# Patient Record
Sex: Male | Born: 1990 | Race: White | Hispanic: No | Smoking: Never smoker
Health system: Southern US, Community
[De-identification: ages and names within clinical notes are randomized; demographics above are authoritative.]

## PROBLEM LIST (undated history)

## (undated) DIAGNOSIS — Z789 Other specified health status: Secondary | ICD-10-CM

---
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
## 2021-01-04 ENCOUNTER — Emergency Department (HOSPITAL_COMMUNITY): Payer: Commercial Managed Care - PPO

## 2021-01-04 ENCOUNTER — Other Ambulatory Visit: Payer: Self-pay

## 2021-01-04 ENCOUNTER — Inpatient Hospital Stay (HOSPITAL_COMMUNITY): Payer: Commercial Managed Care - PPO

## 2021-01-04 ENCOUNTER — Inpatient Hospital Stay (HOSPITAL_COMMUNITY)
Admission: EM | Admit: 2021-01-04 | Discharge: 2021-01-10 | DRG: 460 | Disposition: A | Discharge status: Home / Self Care | Payer: Commercial Managed Care - PPO | Attending: Neurosurgery | Admitting: Neurosurgery

## 2021-01-04 ENCOUNTER — Encounter (HOSPITAL_COMMUNITY): Payer: Self-pay | Admitting: Emergency Medicine

## 2021-01-04 DIAGNOSIS — K567 Ileus, unspecified: Secondary | ICD-10-CM | POA: Diagnosis not present

## 2021-01-04 DIAGNOSIS — G8929 Other chronic pain: Secondary | ICD-10-CM | POA: Diagnosis present

## 2021-01-04 DIAGNOSIS — S32011A Stable burst fracture of first lumbar vertebra, initial encounter for closed fracture: Principal | ICD-10-CM | POA: Diagnosis present

## 2021-01-04 DIAGNOSIS — S32009A Unspecified fracture of unspecified lumbar vertebra, initial encounter for closed fracture: Secondary | ICD-10-CM | POA: Diagnosis present

## 2021-01-04 DIAGNOSIS — E871 Hypo-osmolality and hyponatremia: Secondary | ICD-10-CM | POA: Diagnosis not present

## 2021-01-04 DIAGNOSIS — R112 Nausea with vomiting, unspecified: Secondary | ICD-10-CM

## 2021-01-04 DIAGNOSIS — Z20822 Contact with and (suspected) exposure to covid-19: Secondary | ICD-10-CM | POA: Diagnosis present

## 2021-01-04 DIAGNOSIS — Z419 Encounter for procedure for purposes other than remedying health state, unspecified: Secondary | ICD-10-CM

## 2021-01-04 DIAGNOSIS — M40209 Unspecified kyphosis, site unspecified: Secondary | ICD-10-CM | POA: Diagnosis present

## 2021-01-04 DIAGNOSIS — S32010A Wedge compression fracture of first lumbar vertebra, initial encounter for closed fracture: Secondary | ICD-10-CM

## 2021-01-04 HISTORY — DX: Other specified health status: Z78.9

## 2021-01-04 LAB — RESP PANEL BY RT-PCR (FLU A&B, COVID) ARPGX2
Influenza A by PCR: NEGATIVE
Influenza B by PCR: NEGATIVE
SARS Coronavirus 2 by RT PCR: NEGATIVE

## 2021-01-04 LAB — CBC WITH DIFFERENTIAL/PLATELET
Abs Immature Granulocytes: 0.94 10*3/uL — ABNORMAL HIGH (ref 0.00–0.07)
Basophils Absolute: 0.1 10*3/uL (ref 0.0–0.1)
Basophils Relative: 1 %
Eosinophils Absolute: 0 10*3/uL (ref 0.0–0.5)
Eosinophils Relative: 0 %
HCT: 46.1 % (ref 39.0–52.0)
Hemoglobin: 15.6 g/dL (ref 13.0–17.0)
Immature Granulocytes: 3 %
Lymphocytes Relative: 6 %
Lymphs Abs: 1.8 10*3/uL (ref 0.7–4.0)
MCH: 30.8 pg (ref 26.0–34.0)
MCHC: 33.8 g/dL (ref 30.0–36.0)
MCV: 91.1 fL (ref 80.0–100.0)
Monocytes Absolute: 2.1 10*3/uL — ABNORMAL HIGH (ref 0.1–1.0)
Monocytes Relative: 7 %
Neutro Abs: 24.8 10*3/uL — ABNORMAL HIGH (ref 1.7–7.7)
Neutrophils Relative %: 83 %
Platelets: 357 10*3/uL (ref 150–400)
RBC: 5.06 MIL/uL (ref 4.22–5.81)
RDW: 12.1 % (ref 11.5–15.5)
WBC: 29.8 10*3/uL — ABNORMAL HIGH (ref 4.0–10.5)
nRBC: 0 % (ref 0.0–0.2)

## 2021-01-04 LAB — BASIC METABOLIC PANEL
Anion gap: 10 (ref 5–15)
BUN: 15 mg/dL (ref 6–20)
CO2: 23 mmol/L (ref 22–32)
Calcium: 9.7 mg/dL (ref 8.9–10.3)
Chloride: 102 mmol/L (ref 98–111)
Creatinine, Ser: 0.74 mg/dL (ref 0.61–1.24)
GFR, Estimated: >60 mL/min (ref >60)
Glucose, Bld: 110 mg/dL — ABNORMAL HIGH (ref 70–99)
Potassium: 4.3 mmol/L (ref 3.5–5.1)
Sodium: 135 mmol/L (ref 135–145)
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
## 2021-01-04 IMAGING — MR MR THORACIC SPINE W/O CM
5 of 7 series · 30 of 48 positions shown · non-contrast
Comparison: Prior CTs from earlier the same day.

CLINICAL DATA: Initial evaluation for acute trauma.

EXAM:
MRI THORACIC AND LUMBAR SPINE WITHOUT CONTRAST
TECHNIQUE: Multiplanar and multiecho pulse sequences of the thoracic and lumbar
spine were obtained without intravenous contrast.

[Series 16: T1 · sagittal · 4.0mm · 1.72mm/px · 3 of 12 slices shown (1 of 3)]
[im 1/12]
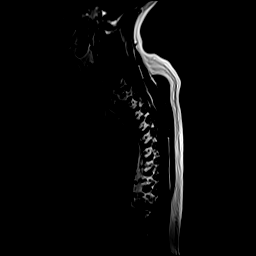
[im 6/12]
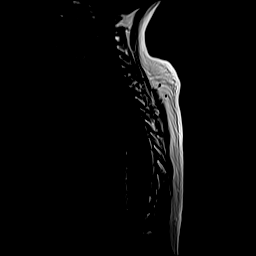
[im 12/12]
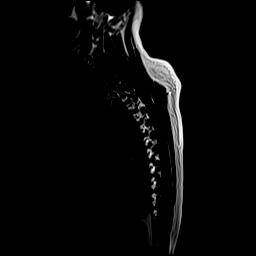

[Series 18: T1 · sagittal · 3.0mm · 1.00mm/px · 4 of 15 slices shown (2 of 3)]
[im 1/15]
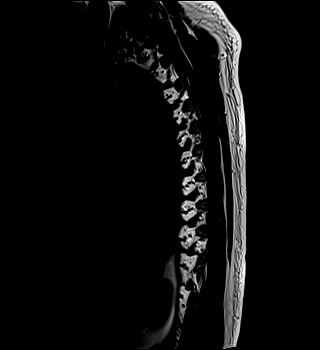
[im 5/15]
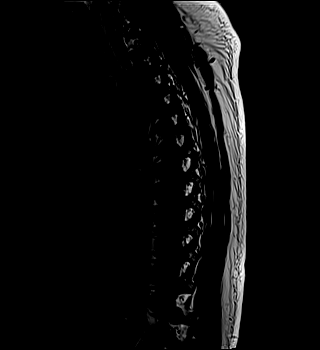
[im 10/15]
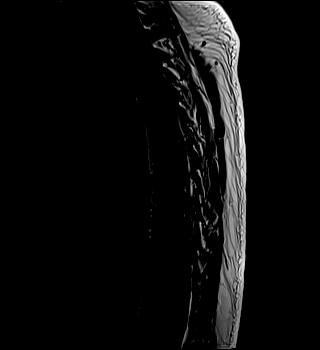
[im 15/15]
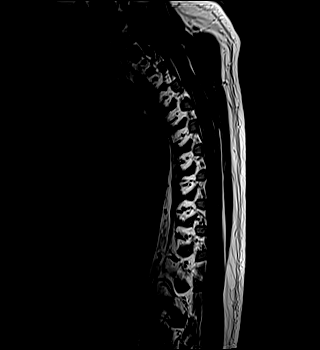

[Series 19: T2 · sagittal · 3.0mm · 1.00mm/px · 4 of 17 slices shown (1 of 2)]
[im 1/17]
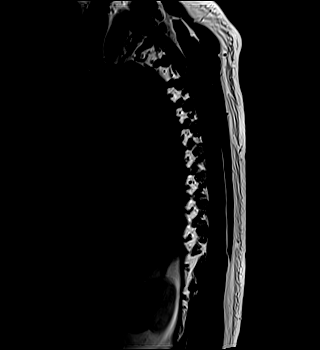
[im 6/17]
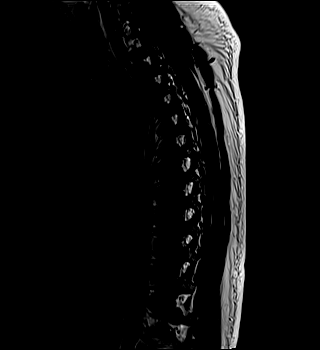
[im 11/17]
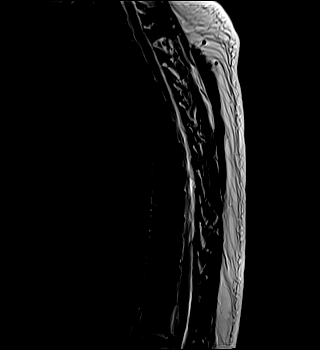
[im 17/17]
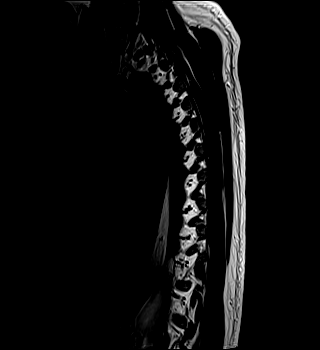

[Series 20: T2 · axial · 4.0mm · 0.78mm/px · z∈[-267,-15]mm · 11 of 42 slices shown (2 of 2)]
[im 1/42]
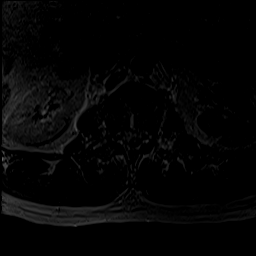
[im 5/42]
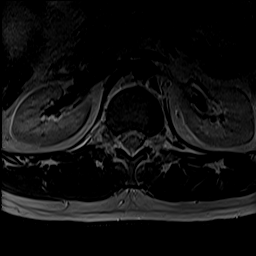
[im 9/42]
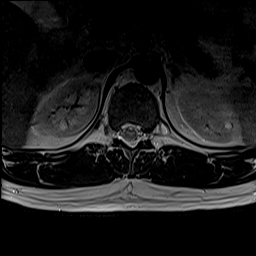
[im 13/42]
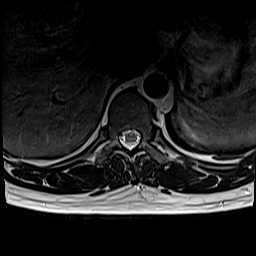
[im 17/42]
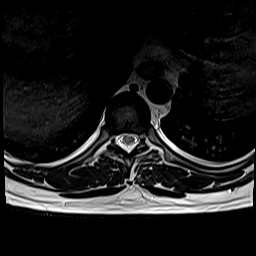
[im 21/42]
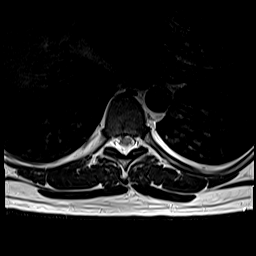
[im 25/42]
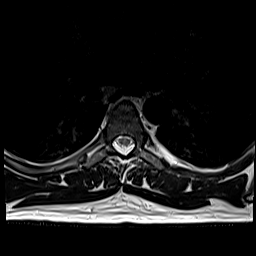
[im 29/42]
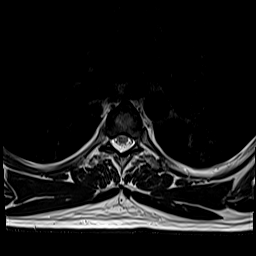
[im 33/42]
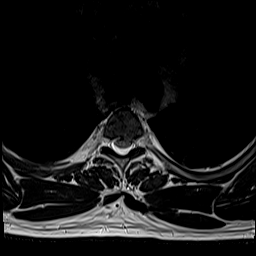
[im 37/42]
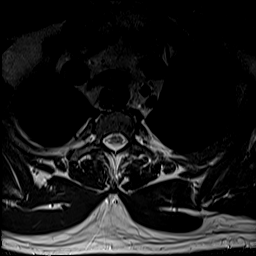
[im 42/42]
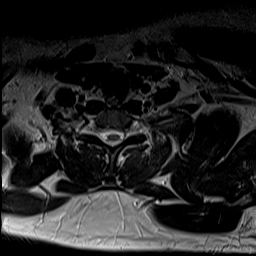

[Series 21: T1 · axial · 4.0mm · 0.39mm/px · z∈[-267,-15]mm · 8 of 42 slices shown (3 of 3)]
[im 1/42]
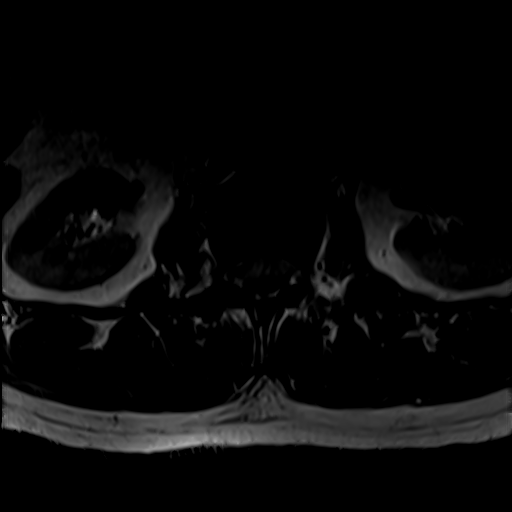
[im 9/42]
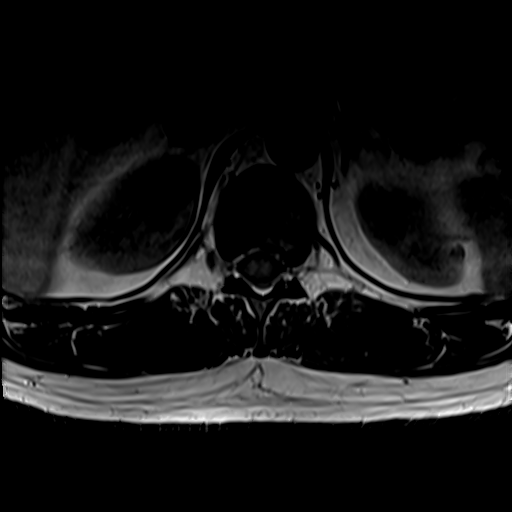
[im 13/42]
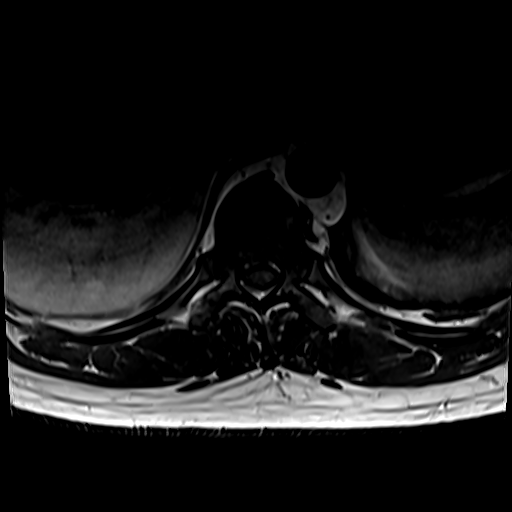
[im 17/42]
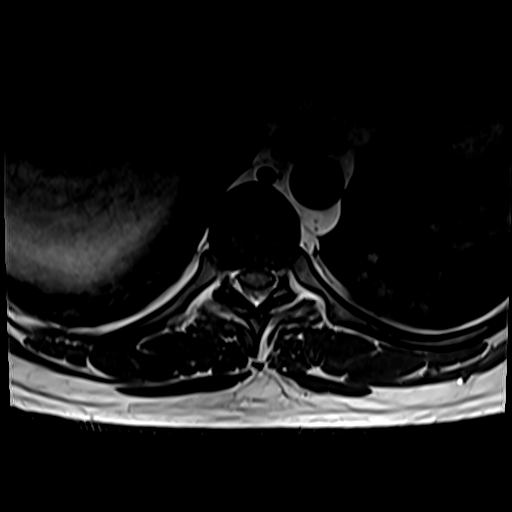
[im 25/42]
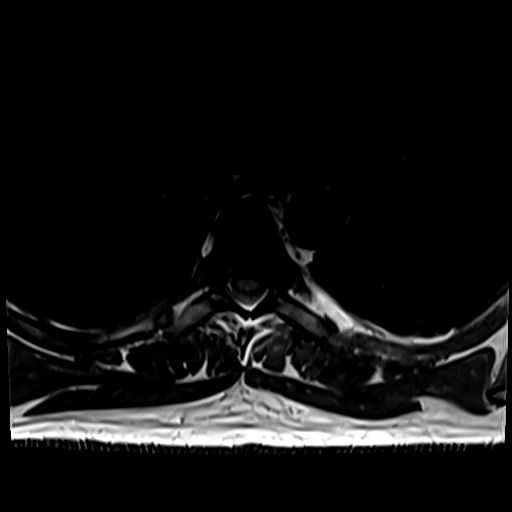
[im 29/42]
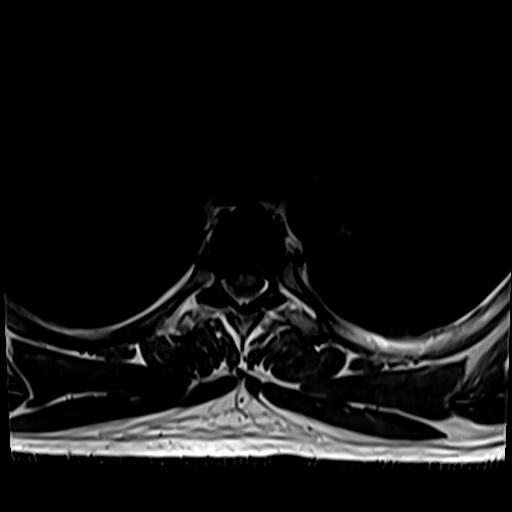
[im 33/42]
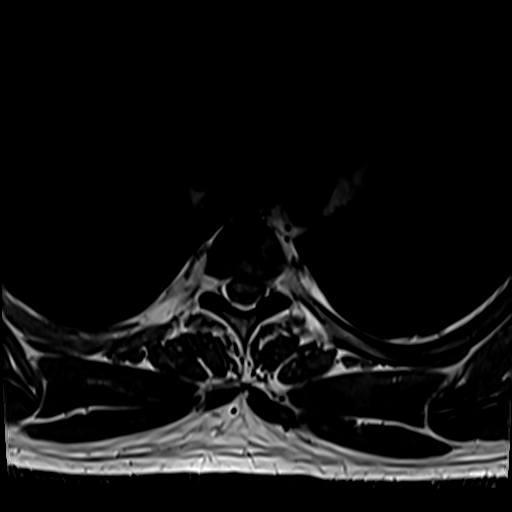
[im 42/42]
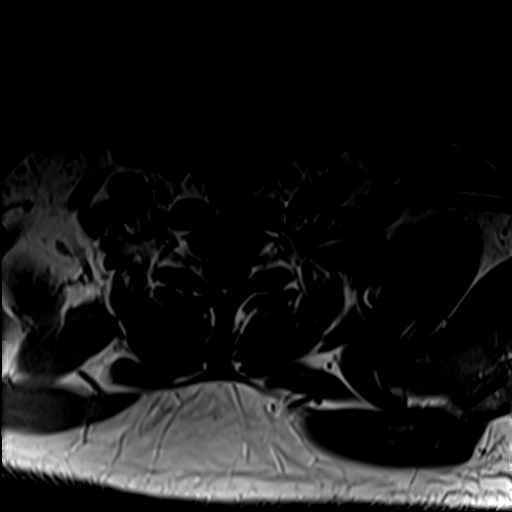

[30 of 48 positions shown; findings below may reference images not displayed]

FINDINGS: MRI THORACIC SPINE FINDINGS

Alignment: Physiologic with preservation of the normal thoracic
kyphosis. No listhesis.

Vertebrae: Vertebral body height maintained without acute or chronic
fracture. Bone marrow signal intensity diffusely decreased on T1
weighted imaging, nonspecific, but most commonly related to anemia,
smoking, or obesity. No worrisome osseous lesions. No abnormal
marrow edema.

Cord: Normal signal and morphology. Probable small amount of
epidural hemorrhage seen within the ventral epidural space extending
to approximately the level of T9 related to the acute lumbar spine
fractures (series 22, image 27). No significant stenosis.

Paraspinal and other soft tissues: Paraspinous soft tissues
demonstrate no acute finding. Subcentimeter simple cyst noted at the
upper pole the left kidney. Visualized visceral structures otherwise
unremarkable.

Disc levels:

T6-7: Small right paracentral disc protrusion mildly indents the
right ventral thecal sac (series 22, image 19). No significant
stenosis.

T7-8: Small right paracentral disc protrusion mildly indents the
right ventral thecal sac (series 22, image 22). No significant
stenosis.

Additional mild noncompressive disc bulging noted elsewhere within
the thoracic spine. No significant spinal stenosis. Foramina remain
patent.

MRI LUMBAR SPINE FINDINGS

Segmentation: Standard. Lowest well-formed disc space labeled the
L5-S1 level.

Alignment: Physiologic with preservation of the normal lumbar
lordosis. No listhesis.

Vertebrae: Acute burst type compression fracture involving the L1
vertebral body again seen. Associated height loss measures up to 35%
with 8 mm bony retropulsion. Additional acute compression fracture
involving the superior endplate of L2 with 15% height loss without
bony retropulsion. Acute compression fracture involving the superior
endplate of L3 with 15% height loss without bony retropulsion. Acute
compression fracture involving L4 with mild 10% height loss without
bony retropulsion.

Additional edema noted about the partially visualized right SI joint
(series 7, image 1). No definite visible fracture. Finding most
likely reflects changes of sacroiliitis. Possible more mild changes
about the contralateral left SI joint noted as well. Remainder of
the partially visualized sacrum and pelvis otherwise grossly intact.

Decreased T1 signal intensity seen throughout the visualized bone
marrow. No worrisome osseous lesions.

Conus medullaris and cauda equina: Conus extends to the T12-L1
level. Conus and cauda equina appear normal.

Paraspinal and other soft tissues: Paraspinous edema adjacent to the
acute upper lumbar spinal fractures. Subcentimeter simple cyst at
the upper pole the left kidney. Visualized visceral structures
otherwise unremarkable.

Disc levels:

T12-L1: 8 mm bony retropulsion related to the acute L1 compression
fracture. Small amount of epidural blood products seen coursing
cephalad and caudad within the adjacent ventral epidural space.
Resultant severe spinal stenosis with the thecal sac measuring 4-5
mm at its most narrow point. Foramina remain patent.

L1-2: Negative interspace. Small amount of epidural and/or
intradural hemorrhage at the ventral thecal sac. Mild to moderate
spinal stenosis. Foramina remain patent.

L2-3: Negative interspace. No significant spinal stenosis. Foramina
remain patent.

L3-4: Circumferential disc bulge with disc desiccation. Endplate
spurring. Superimposed central disc protrusion with annular fissure.
Prominence of the dorsal epidural fat. Resultant fairly severe
spinal stenosis. Foramina remain patent.

L4-5: Degenerative intervertebral disc space narrowing with disc
desiccation and diffuse disc bulge. Endplate spurring. Central
annular fissure. Mild facet hypertrophy. Mild epidural lipomatosis.
Mild spinal stenosis. Foramina remain patent.

L5-S1: Degenerative intervertebral disc desiccation and reactive
endplate spurring. Broad-based central disc protrusion closely
approximates the descending S1 nerve roots bilaterally as they
course through the lateral recesses. No significant spinal stenosis.
Foramina remain patent.
IMPRESSION: 1. Acute burst type compression fracture involving the L1 vertebral
body with up to 35% height loss and 8 mm bony retropulsion.
Superimposed epidural and/or intradural hemorrhage with resultant
severe spinal stenosis.
2. Additional acute compression fractures involving the L2 through
L4 vertebral bodies with mild 10-15% height loss without bony
retropulsion.
3. No other acute traumatic injury within the thoracic spine.
4. Edema about the partially visualized right SI joint, most likely
related to changes of sacroiliitis. Possible more mild changes about
the contralateral left SI joint also noted. Follow-up examination
with dedicated nonemergent pelvic MRI could be performed for further
evaluation as warranted.
5. Multifactorial degenerative changes at L3-4 with resultant severe
spinal stenosis.
6. Broad-based central disc protrusion at L5-S1, closely
approximating the descending S1 nerve roots without frank
impingement.
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
## 2021-01-04 MED ORDER — HYDROCODONE-ACETAMINOPHEN 5-325 MG PO TABS
2.0000 | ORAL_TABLET | ORAL | Status: DC | PRN
  Administered 2021-01-05 – 2021-01-06 (×3): 2 via ORAL
  Filled 2021-01-04 (×3): qty 2

## 2021-01-04 MED ORDER — ACETAMINOPHEN 325 MG PO TABS: 650.0000 mg | ORAL_TABLET | ORAL | Status: DC | PRN

## 2021-01-04 MED ORDER — FENTANYL CITRATE PF 50 MCG/ML IJ SOSY
100.0000 ug | PREFILLED_SYRINGE | Freq: Once | INTRAMUSCULAR | Status: AC
  Administered 2021-01-04: 100 ug via INTRAVENOUS
  Filled 2021-01-04: qty 2

## 2021-01-04 MED ORDER — METHOCARBAMOL 1000 MG/10ML IJ SOLN
500.0000 mg | Freq: Four times a day (QID) | INTRAVENOUS | Status: DC | PRN
  Filled 2021-01-04: qty 5

## 2021-01-04 MED ORDER — ACETAMINOPHEN 650 MG RE SUPP: 650.0000 mg | RECTAL | Status: DC | PRN

## 2021-01-04 MED ORDER — MENTHOL 3 MG MT LOZG
1.0000 | LOZENGE | OROMUCOSAL | Status: DC | PRN
  Filled 2021-01-04: qty 9

## 2021-01-04 MED ORDER — HYDROMORPHONE HCL 1 MG/ML IJ SOLN
0.5000 mg | INTRAMUSCULAR | Status: DC | PRN
  Administered 2021-01-04 – 2021-01-06 (×9): 0.5 mg via INTRAVENOUS
  Filled 2021-01-04: qty 0.5
  Filled 2021-01-04 (×2): qty 1
  Filled 2021-01-04: qty 0.5
  Filled 2021-01-04: qty 1
  Filled 2021-01-04 (×2): qty 0.5
  Filled 2021-01-04 (×3): qty 1

## 2021-01-04 MED ORDER — SODIUM CHLORIDE 0.9 % IV SOLN: 250.0000 mL | INTRAVENOUS | Status: DC

## 2021-01-04 MED ORDER — HYDROCODONE-ACETAMINOPHEN 5-325 MG PO TABS
1.0000 | ORAL_TABLET | ORAL | Status: DC | PRN
  Filled 2021-01-04 (×2): qty 1

## 2021-01-04 MED ORDER — FLEET ENEMA 7-19 GM/118ML RE ENEM: 1.0000 | ENEMA | Freq: Once | RECTAL | Status: DC | PRN

## 2021-01-04 MED ORDER — POLYETHYLENE GLYCOL 3350 17 G PO PACK: 17.0000 g | PACK | Freq: Every day | ORAL | Status: DC | PRN

## 2021-01-04 MED ORDER — ONDANSETRON HCL 4 MG/2ML IJ SOLN: 4.0000 mg | Freq: Four times a day (QID) | INTRAMUSCULAR | Status: DC | PRN

## 2021-01-04 MED ORDER — SODIUM CHLORIDE 0.9% FLUSH: 3.0000 mL | INTRAVENOUS | Status: DC | PRN

## 2021-01-04 MED ORDER — SODIUM CHLORIDE 0.9% FLUSH
3.0000 mL | Freq: Two times a day (BID) | INTRAVENOUS | Status: DC
  Administered 2021-01-04 – 2021-01-05 (×3): 3 mL via INTRAVENOUS

## 2021-01-04 MED ORDER — DOCUSATE SODIUM 100 MG PO CAPS
100.0000 mg | ORAL_CAPSULE | Freq: Two times a day (BID) | ORAL | Status: DC
  Administered 2021-01-05: 100 mg via ORAL
  Filled 2021-01-04: qty 1

## 2021-01-04 MED ORDER — POTASSIUM CHLORIDE IN NACL 20-0.9 MEQ/L-% IV SOLN
INTRAVENOUS | Status: DC
  Filled 2021-01-04: qty 1000

## 2021-01-04 MED ORDER — METHOCARBAMOL 500 MG PO TABS
500.0000 mg | ORAL_TABLET | Freq: Four times a day (QID) | ORAL | Status: DC | PRN
  Administered 2021-01-05: 500 mg via ORAL
  Filled 2021-01-04: qty 1

## 2021-01-04 MED ORDER — FENTANYL CITRATE PF 50 MCG/ML IJ SOSY
50.0000 ug | PREFILLED_SYRINGE | Freq: Once | INTRAMUSCULAR | Status: AC
  Administered 2021-01-04: 50 ug via INTRAVENOUS
  Filled 2021-01-04: qty 1

## 2021-01-04 MED ORDER — ENOXAPARIN SODIUM 40 MG/0.4ML IJ SOSY: 40.0000 mg | PREFILLED_SYRINGE | INTRAMUSCULAR | Status: AC

## 2021-01-04 MED ORDER — ONDANSETRON HCL 4 MG PO TABS: 4.0000 mg | ORAL_TABLET | Freq: Four times a day (QID) | ORAL | Status: DC | PRN

## 2021-01-04 MED ORDER — PHENOL 1.4 % MT LIQD
1.0000 | OROMUCOSAL | Status: DC | PRN
  Filled 2021-01-04: qty 177
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
## 2021-01-04 NOTE — ED Notes (Signed)
MRI called 

## 2021-01-05 ENCOUNTER — Other Ambulatory Visit: Payer: Self-pay | Admitting: Neurosurgery

## 2021-01-05 NOTE — ED Notes (Signed)
Pt's oxygen dropped to 87. I placed him on 2L of oxygen by nasal cannula and his oxygen went back to 93

## 2021-01-05 NOTE — ED Notes (Signed)
RN gave patients friend, Katrine Coho [phone], update.

## 2021-01-05 NOTE — Progress Notes (Addendum)
Patient arrived to [facility] 3W08 from [facility]. VSS. Safety precautions and orders reviewed with patient. SCDs applied. Attending MD  paged/left message with after hr call service. Patient also request to have his mother called.  Patient currently denied any pain or appears in any distress. Will continue to monitor.   RN did see patient on the schedule for OR tomorrow and made him aware to be NPO after MN.
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
## 2021-01-06 ENCOUNTER — Inpatient Hospital Stay (HOSPITAL_COMMUNITY): Payer: Commercial Managed Care - PPO | Admitting: Anesthesiology

## 2021-01-06 ENCOUNTER — Encounter (HOSPITAL_COMMUNITY)
Admission: EM | Disposition: A | Discharge status: Home / Self Care | Payer: Self-pay | Source: Home / Self Care | Attending: Neurosurgery

## 2021-01-06 ENCOUNTER — Inpatient Hospital Stay (HOSPITAL_COMMUNITY): Payer: Commercial Managed Care - PPO

## 2021-01-06 ENCOUNTER — Encounter (HOSPITAL_COMMUNITY): Payer: Self-pay | Admitting: Neurosurgery

## 2021-01-06 HISTORY — PX: POSTERIOR LUMBAR FUSION 4 LEVEL: SHX6037

## 2021-01-06 LAB — SURGICAL PCR SCREEN
MRSA, PCR: NEGATIVE
Staphylococcus aureus: POSITIVE — AB

## 2021-01-06 LAB — TYPE AND SCREEN
ABO/RH(D): A POS
Antibody Screen: NEGATIVE

## 2021-01-06 LAB — ABO/RH: ABO/RH(D): A POS

## 2021-01-06 IMAGING — CR DG CHEST 1V
1 series · 1 of 1 positions shown · non-contrast
Comparison: None.

CLINICAL DATA: Shortness of breath

EXAM:
CHEST  1 VIEW

[chest ap]
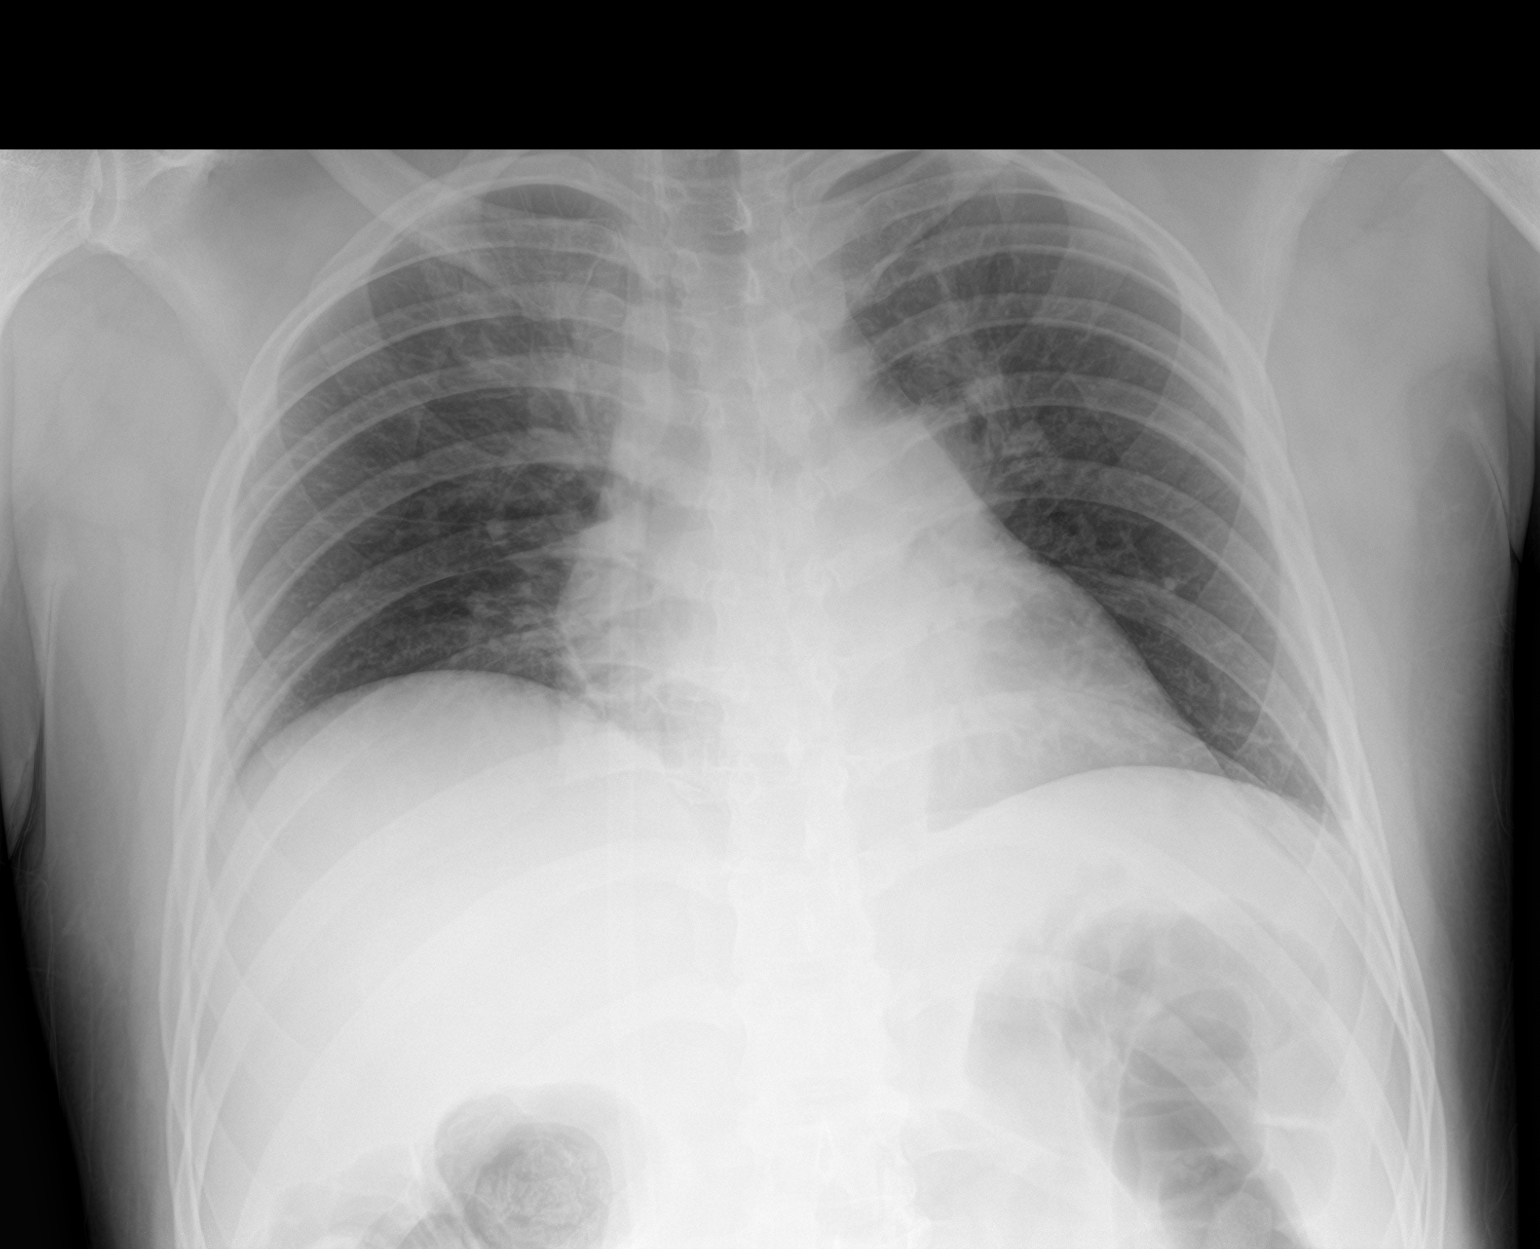

[1 of 1 positions shown; findings below may reference images not displayed]

FINDINGS: There is poor inspiration. Transverse diameter of heart is slightly
increased which may be partly due to poor inspiration. There are no
signs of pulmonary edema or focal pulmonary consolidation. There are
linear densities in the right upper lung field and medial right
lower lung fields. There is no significant pleural effusion or
pneumothorax.
IMPRESSION: There are no signs of pulmonary edema or focal pulmonary
consolidation. There are small linear densities in the right upper
and medial right lower lung fields suggesting scarring or
subsegmental atelectasis.

## 2021-01-06 IMAGING — RF DG THORACOLUMBAR SPINE 2V
1 series · 5 of 5 positions shown · non-contrast
Comparison: Thoracic and lumbar spine CT and MRI [DATE].

CLINICAL DATA: T11-L3 posterior fusion.

EXAM:
THORACOLUMBAR SPINE 1V

[Series 1: run · 5 of 5 slices shown]
[im 1/5]
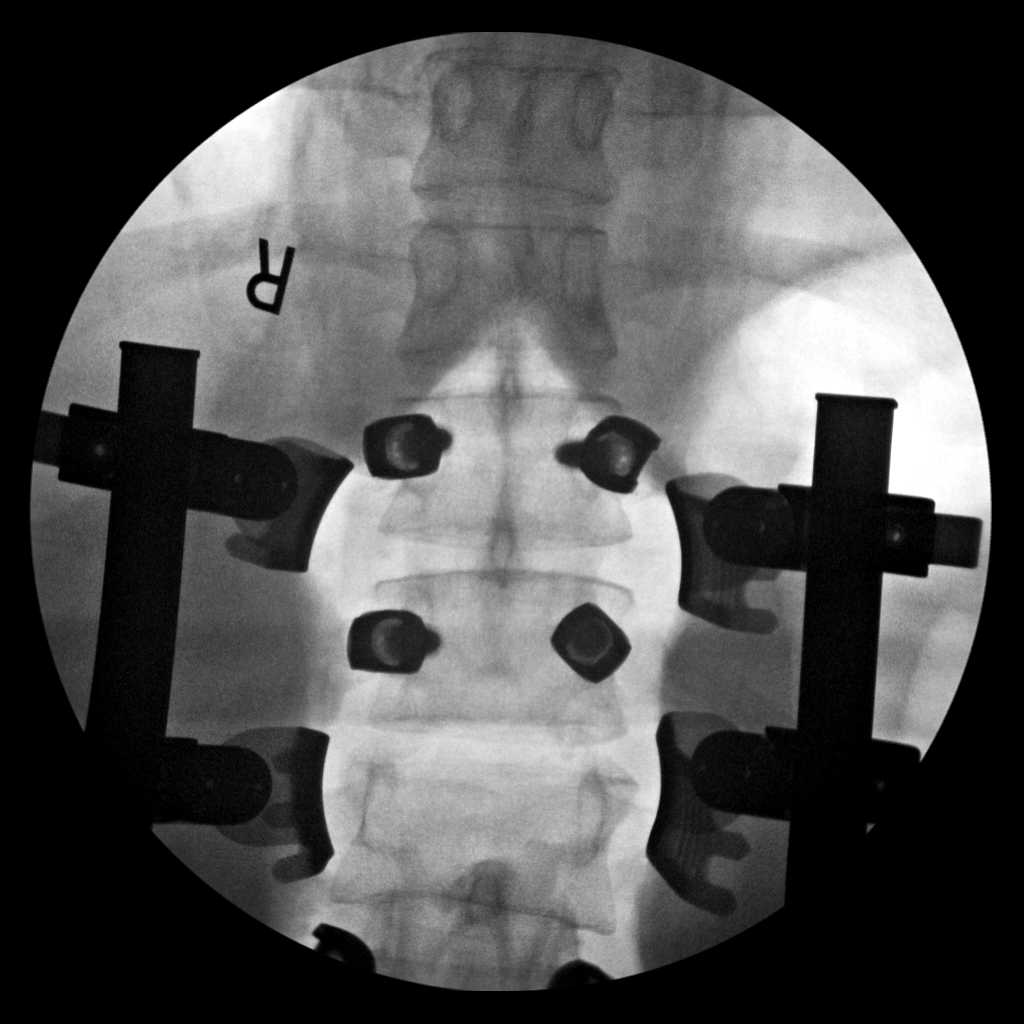
[im 2/5]
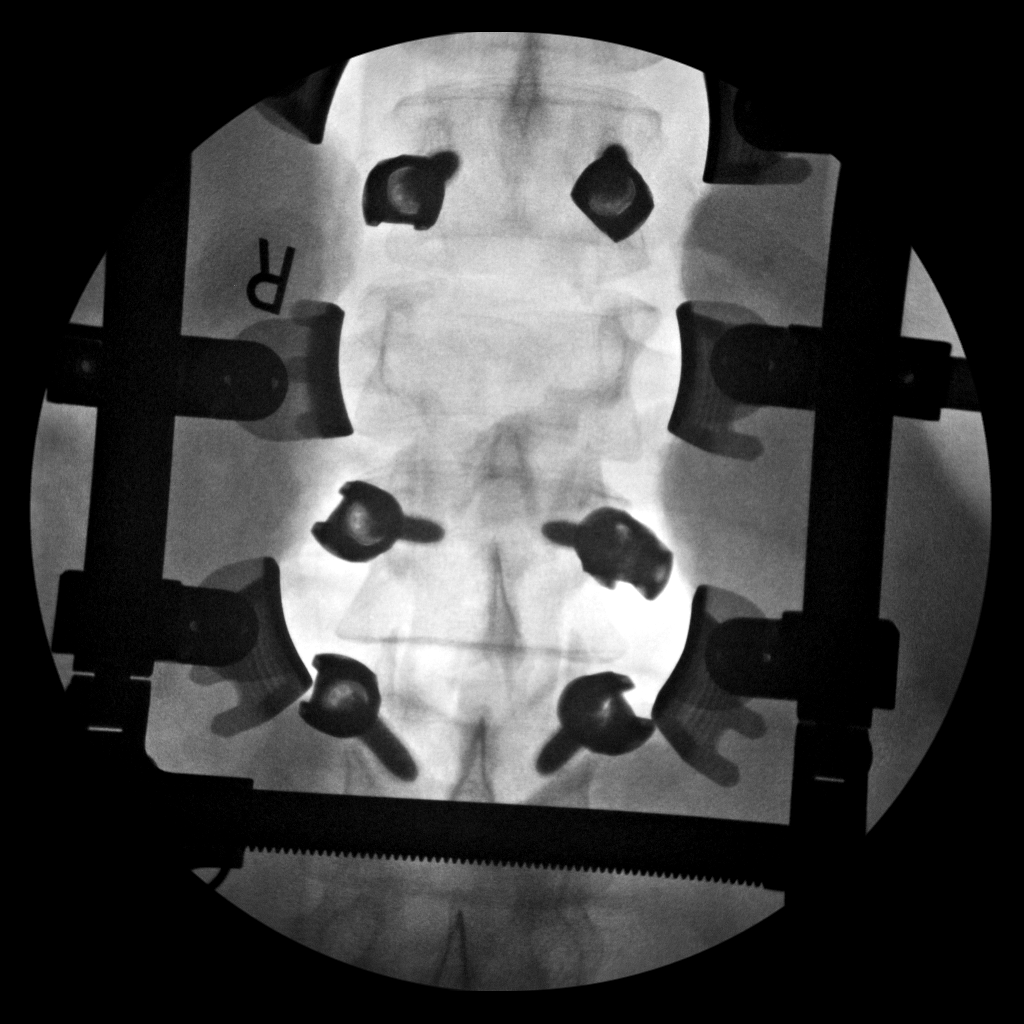
[im 3/5]
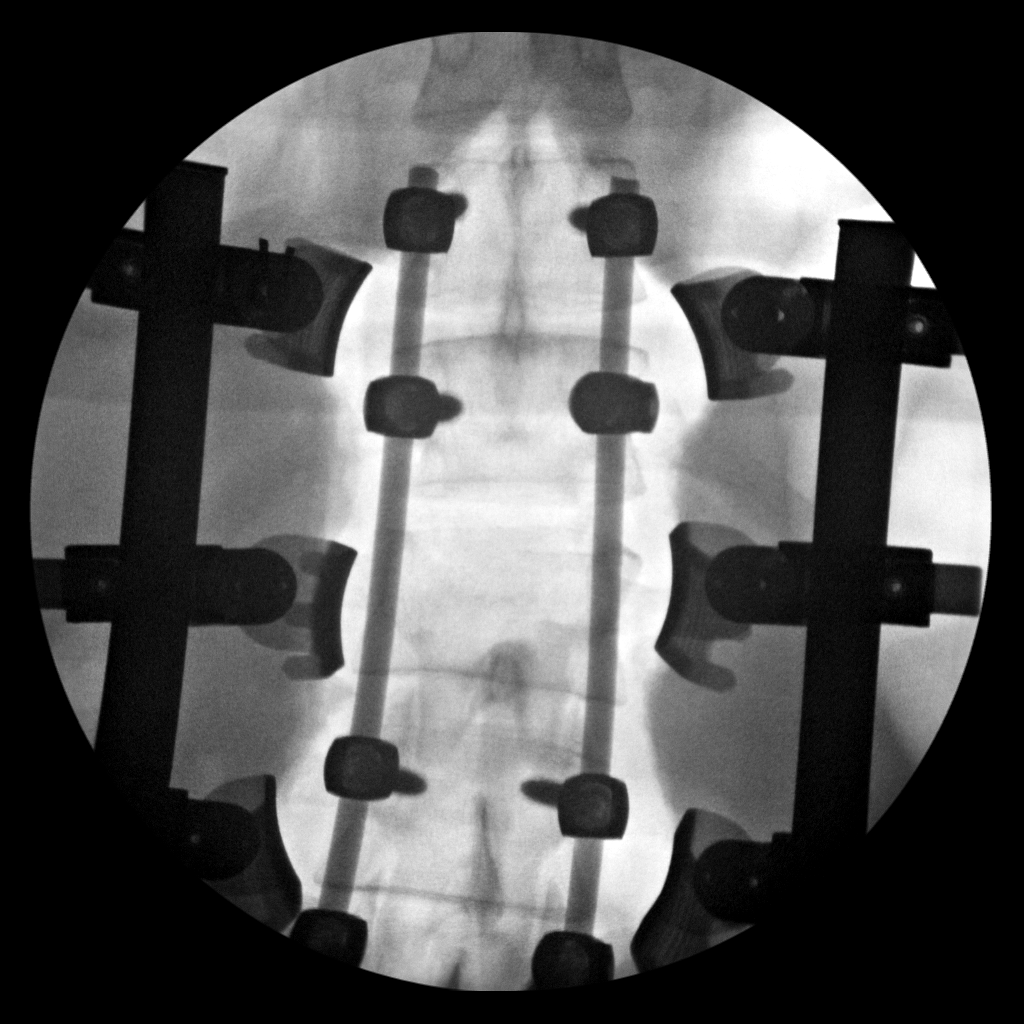
[im 4/5]
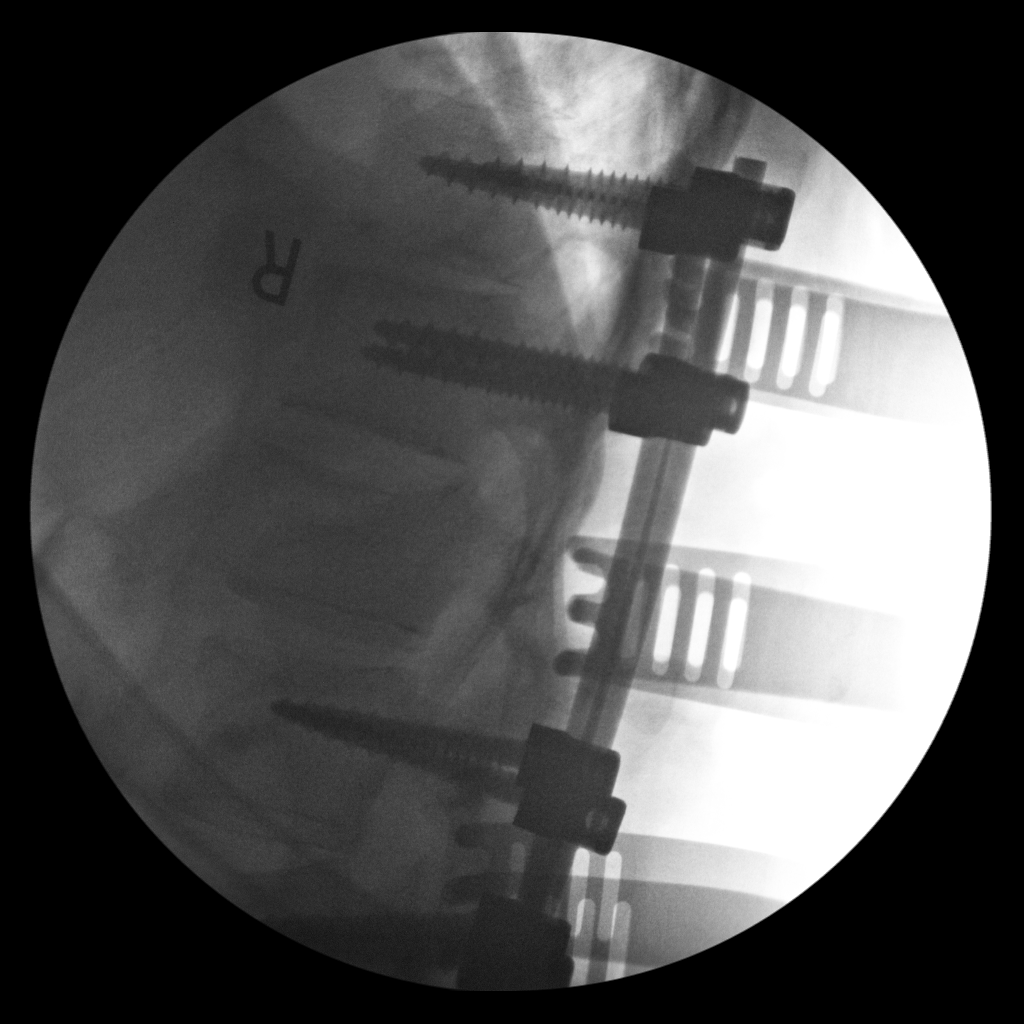
[im 5/5]
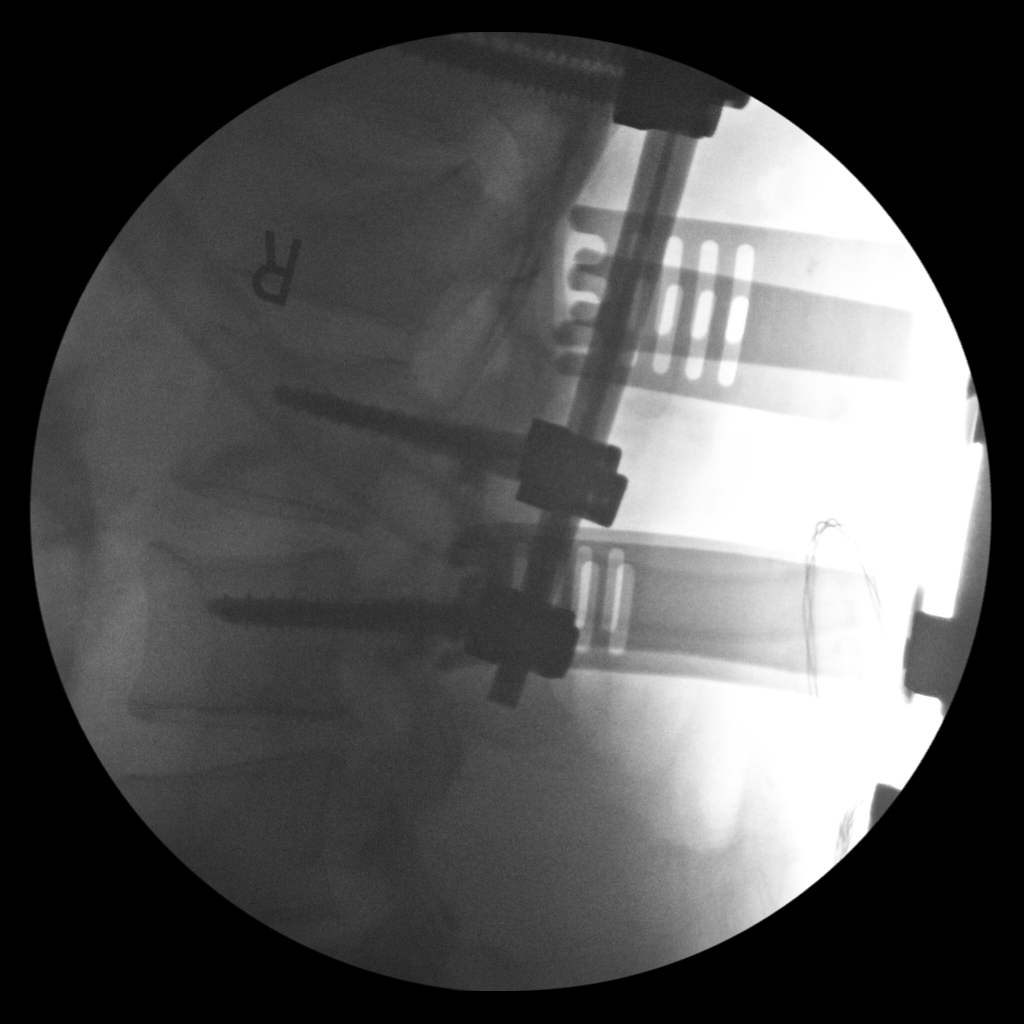

[5 of 5 positions shown; findings below may reference images not displayed]

FINDINGS: Intraoperative thoracolumbar spine.

5 low resolution intraoperative spot views of the thoracolumbar
spine were obtained. T11-L3 posterior fusion hardware is present.
Alignment is grossly anatomic.

Total fluoroscopy time: 1 minute 48 seconds

Total radiation dose: 53.62 micro Gy
IMPRESSION: Intraoperative T11-L3 posterior fusion.

## 2021-01-06 SURGERY — POSTERIOR LUMBAR FUSION 4 LEVEL
Anesthesia: General

## 2021-01-06 MED ORDER — SODIUM CHLORIDE 0.9% FLUSH
3.0000 mL | Freq: Two times a day (BID) | INTRAVENOUS | Status: DC
  Administered 2021-01-07 – 2021-01-10 (×7): 3 mL via INTRAVENOUS

## 2021-01-06 MED ORDER — FENTANYL CITRATE (PF) 250 MCG/5ML IJ SOLN
INTRAMUSCULAR | Status: AC
  Filled 2021-01-06: qty 5

## 2021-01-06 MED ORDER — ROCURONIUM BROMIDE 10 MG/ML (PF) SYRINGE
PREFILLED_SYRINGE | INTRAVENOUS | Status: AC
  Filled 2021-01-06: qty 10

## 2021-01-06 MED ORDER — DEXAMETHASONE SODIUM PHOSPHATE 10 MG/ML IJ SOLN
INTRAMUSCULAR | Status: DC | PRN
  Administered 2021-01-06: 10 mg via INTRAVENOUS

## 2021-01-06 MED ORDER — LIDOCAINE-EPINEPHRINE 1 %-1:100000 IJ SOLN
INTRAMUSCULAR | Status: AC
  Filled 2021-01-06: qty 1

## 2021-01-06 MED ORDER — MIDAZOLAM HCL 5 MG/5ML IJ SOLN
INTRAMUSCULAR | Status: DC | PRN
  Administered 2021-01-06: 2 mg via INTRAVENOUS

## 2021-01-06 MED ORDER — LIDOCAINE 2% (20 MG/ML) 5 ML SYRINGE
INTRAMUSCULAR | Status: AC
  Filled 2021-01-06: qty 5

## 2021-01-06 MED ORDER — PROPOFOL 500 MG/50ML IV EMUL
INTRAVENOUS | Status: DC | PRN
  Administered 2021-01-06: 125 ug/kg/min via INTRAVENOUS

## 2021-01-06 MED ORDER — BUPIVACAINE LIPOSOME 1.3 % IJ SUSP
INTRAMUSCULAR | Status: DC | PRN
  Administered 2021-01-06: 20 mL

## 2021-01-06 MED ORDER — DOCUSATE SODIUM 100 MG PO CAPS
100.0000 mg | ORAL_CAPSULE | Freq: Two times a day (BID) | ORAL | Status: DC
  Administered 2021-01-06 – 2021-01-10 (×8): 100 mg via ORAL
  Filled 2021-01-06 (×8): qty 1

## 2021-01-06 MED ORDER — MENTHOL 3 MG MT LOZG
1.0000 | LOZENGE | OROMUCOSAL | Status: DC | PRN
  Filled 2021-01-06: qty 9

## 2021-01-06 MED ORDER — POTASSIUM CHLORIDE IN NACL 20-0.9 MEQ/L-% IV SOLN
INTRAVENOUS | Status: DC
  Filled 2021-01-06: qty 1000

## 2021-01-06 MED ORDER — ACETAMINOPHEN 10 MG/ML IV SOLN: 1000.0000 mg | Freq: Once | INTRAVENOUS | Status: DC | PRN

## 2021-01-06 MED ORDER — ACETAMINOPHEN 500 MG PO TABS: 1000.0000 mg | ORAL_TABLET | Freq: Once | ORAL | Status: DC | PRN

## 2021-01-06 MED ORDER — PROPOFOL 10 MG/ML IV BOLUS
INTRAVENOUS | Status: DC | PRN
  Administered 2021-01-06: 110 mg via INTRAVENOUS
  Administered 2021-01-06: 20 mg via INTRAVENOUS
  Administered 2021-01-06: 70 mg via INTRAVENOUS

## 2021-01-06 MED ORDER — ONDANSETRON HCL 4 MG PO TABS
4.0000 mg | ORAL_TABLET | Freq: Four times a day (QID) | ORAL | Status: DC | PRN
  Administered 2021-01-08: 03:00:00 4 mg via ORAL

## 2021-01-06 MED ORDER — OXYCODONE HCL 5 MG PO TABS
10.0000 mg | ORAL_TABLET | ORAL | Status: DC | PRN
  Administered 2021-01-06 – 2021-01-09 (×13): 10 mg via ORAL
  Filled 2021-01-06 (×14): qty 2

## 2021-01-06 MED ORDER — ALBUMIN HUMAN 5 % IV SOLN: INTRAVENOUS | Status: DC | PRN

## 2021-01-06 MED ORDER — TRANEXAMIC ACID-NACL 1000-0.7 MG/100ML-% IV SOLN
INTRAVENOUS | Status: AC
  Filled 2021-01-06: qty 100

## 2021-01-06 MED ORDER — ONDANSETRON HCL 4 MG/2ML IJ SOLN
INTRAMUSCULAR | Status: AC
  Filled 2021-01-06: qty 2

## 2021-01-06 MED ORDER — ENOXAPARIN SODIUM 40 MG/0.4ML IJ SOSY
40.0000 mg | PREFILLED_SYRINGE | INTRAMUSCULAR | Status: DC
  Administered 2021-01-07 – 2021-01-10 (×4): 40 mg via SUBCUTANEOUS
  Filled 2021-01-06 (×4): qty 0.4

## 2021-01-06 MED ORDER — LIDOCAINE 2% (20 MG/ML) 5 ML SYRINGE
INTRAMUSCULAR | Status: DC | PRN
  Administered 2021-01-06: 60 mg via INTRAVENOUS

## 2021-01-06 MED ORDER — CEFAZOLIN SODIUM-DEXTROSE 1-4 GM/50ML-% IV SOLN
1.0000 g | Freq: Three times a day (TID) | INTRAVENOUS | Status: AC
  Administered 2021-01-06 – 2021-01-08 (×6): 1 g via INTRAVENOUS
  Filled 2021-01-06 (×6): qty 50

## 2021-01-06 MED ORDER — ONDANSETRON HCL 4 MG/2ML IJ SOLN
INTRAMUSCULAR | Status: DC | PRN
  Administered 2021-01-06: 4 mg via INTRAVENOUS

## 2021-01-06 MED ORDER — PROPOFOL 1000 MG/100ML IV EMUL
INTRAVENOUS | Status: AC
  Filled 2021-01-06: qty 100

## 2021-01-06 MED ORDER — METHOCARBAMOL 500 MG PO TABS
500.0000 mg | ORAL_TABLET | Freq: Four times a day (QID) | ORAL | Status: DC | PRN
  Administered 2021-01-08 – 2021-01-10 (×2): 500 mg via ORAL
  Filled 2021-01-06 (×2): qty 1

## 2021-01-06 MED ORDER — 0.9 % SODIUM CHLORIDE (POUR BTL) OPTIME
TOPICAL | Status: DC | PRN
  Administered 2021-01-06: 1000 mL

## 2021-01-06 MED ORDER — ACETAMINOPHEN 325 MG PO TABS
650.0000 mg | ORAL_TABLET | ORAL | Status: DC | PRN
  Administered 2021-01-08: 03:00:00 650 mg via ORAL
  Filled 2021-01-06: qty 2

## 2021-01-06 MED ORDER — PHENOL 1.4 % MT LIQD: 1.0000 | OROMUCOSAL | Status: DC | PRN

## 2021-01-06 MED ORDER — CHLORHEXIDINE GLUCONATE CLOTH 2 % EX PADS: 6.0000 | MEDICATED_PAD | Freq: Once | CUTANEOUS | Status: DC

## 2021-01-06 MED ORDER — BUPIVACAINE LIPOSOME 1.3 % IJ SUSP
INTRAMUSCULAR | Status: AC
  Filled 2021-01-06: qty 20

## 2021-01-06 MED ORDER — BUPIVACAINE HCL (PF) 0.5 % IJ SOLN
INTRAMUSCULAR | Status: DC | PRN
  Administered 2021-01-06: 30 mL

## 2021-01-06 MED ORDER — LIDOCAINE-EPINEPHRINE 1 %-1:100000 IJ SOLN
INTRAMUSCULAR | Status: DC | PRN
  Administered 2021-01-06: 17 mL

## 2021-01-06 MED ORDER — POLYETHYLENE GLYCOL 3350 17 G PO PACK: 17.0000 g | PACK | Freq: Every day | ORAL | Status: DC | PRN

## 2021-01-06 MED ORDER — FLEET ENEMA 7-19 GM/118ML RE ENEM: 1.0000 | ENEMA | Freq: Once | RECTAL | Status: DC | PRN

## 2021-01-06 MED ORDER — BACITRACIN ZINC 500 UNIT/GM EX OINT
TOPICAL_OINTMENT | CUTANEOUS | Status: AC
  Filled 2021-01-06: qty 28.35

## 2021-01-06 MED ORDER — SENNA 8.6 MG PO TABS
1.0000 | ORAL_TABLET | Freq: Two times a day (BID) | ORAL | Status: DC
  Administered 2021-01-06 – 2021-01-10 (×8): 8.6 mg via ORAL
  Filled 2021-01-06 (×9): qty 1

## 2021-01-06 MED ORDER — OXYCODONE HCL 5 MG PO TABS
5.0000 mg | ORAL_TABLET | ORAL | Status: DC | PRN
  Administered 2021-01-08 – 2021-01-10 (×6): 5 mg via ORAL
  Filled 2021-01-06 (×7): qty 1

## 2021-01-06 MED ORDER — CEFAZOLIN SODIUM-DEXTROSE 2-4 GM/100ML-% IV SOLN
2.0000 g | INTRAVENOUS | Status: AC
  Administered 2021-01-06: 2 g via INTRAVENOUS

## 2021-01-06 MED ORDER — FENTANYL CITRATE (PF) 100 MCG/2ML IJ SOLN: 25.0000 ug | INTRAMUSCULAR | Status: DC | PRN

## 2021-01-06 MED ORDER — MUPIROCIN 2 % EX OINT: 1.0000 "application " | TOPICAL_OINTMENT | Freq: Two times a day (BID) | CUTANEOUS | Status: DC

## 2021-01-06 MED ORDER — TRANEXAMIC ACID-NACL 1000-0.7 MG/100ML-% IV SOLN
INTRAVENOUS | Status: DC | PRN
  Administered 2021-01-06: 1000 mg via INTRAVENOUS

## 2021-01-06 MED ORDER — CEFAZOLIN SODIUM-DEXTROSE 2-4 GM/100ML-% IV SOLN
INTRAVENOUS | Status: AC
  Filled 2021-01-06: qty 100

## 2021-01-06 MED ORDER — CHLORHEXIDINE GLUCONATE 0.12 % MT SOLN
OROMUCOSAL | Status: AC
  Administered 2021-01-06: 15 mL via OROMUCOSAL
  Filled 2021-01-06: qty 15

## 2021-01-06 MED ORDER — SODIUM CHLORIDE 0.9% FLUSH: 3.0000 mL | INTRAVENOUS | Status: DC | PRN

## 2021-01-06 MED ORDER — HYDROMORPHONE HCL 1 MG/ML IJ SOLN: 0.5000 mg | INTRAMUSCULAR | Status: DC | PRN

## 2021-01-06 MED ORDER — ACETAMINOPHEN 160 MG/5ML PO SOLN: 1000.0000 mg | Freq: Once | ORAL | Status: DC | PRN

## 2021-01-06 MED ORDER — HYDROMORPHONE HCL 1 MG/ML IJ SOLN
INTRAMUSCULAR | Status: AC
  Administered 2021-01-06: 0.5 mg via INTRAVENOUS
  Filled 2021-01-06: qty 1

## 2021-01-06 MED ORDER — THROMBIN 5000 UNITS EX SOLR
CUTANEOUS | Status: AC
  Filled 2021-01-06: qty 5000

## 2021-01-06 MED ORDER — FENTANYL CITRATE (PF) 250 MCG/5ML IJ SOLN
INTRAMUSCULAR | Status: DC | PRN
  Administered 2021-01-06 (×2): 50 ug via INTRAVENOUS
  Administered 2021-01-06: 100 ug via INTRAVENOUS
  Administered 2021-01-06 (×4): 50 ug via INTRAVENOUS

## 2021-01-06 MED ORDER — THROMBIN 5000 UNITS EX SOLR
OROMUCOSAL | Status: DC | PRN
  Administered 2021-01-06: 5 mL via TOPICAL

## 2021-01-06 MED ORDER — ROCURONIUM BROMIDE 10 MG/ML (PF) SYRINGE
PREFILLED_SYRINGE | INTRAVENOUS | Status: AC
  Filled 2021-01-06: qty 20

## 2021-01-06 MED ORDER — SUGAMMADEX SODIUM 200 MG/2ML IV SOLN
INTRAVENOUS | Status: DC | PRN
  Administered 2021-01-06: 200 mg via INTRAVENOUS

## 2021-01-06 MED ORDER — LACTATED RINGERS IV SOLN: INTRAVENOUS | Status: DC

## 2021-01-06 MED ORDER — CHLORHEXIDINE GLUCONATE CLOTH 2 % EX PADS: 6.0000 | MEDICATED_PAD | Freq: Every day | CUTANEOUS | Status: DC

## 2021-01-06 MED ORDER — MIDAZOLAM HCL 2 MG/2ML IJ SOLN
INTRAMUSCULAR | Status: AC
  Filled 2021-01-06: qty 2

## 2021-01-06 MED ORDER — DEXAMETHASONE SODIUM PHOSPHATE 10 MG/ML IJ SOLN
INTRAMUSCULAR | Status: AC
  Filled 2021-01-06: qty 1

## 2021-01-06 MED ORDER — BUPIVACAINE HCL (PF) 0.5 % IJ SOLN
INTRAMUSCULAR | Status: AC
  Filled 2021-01-06: qty 30

## 2021-01-06 MED ORDER — ACETAMINOPHEN 650 MG RE SUPP: 650.0000 mg | RECTAL | Status: DC | PRN

## 2021-01-06 MED ORDER — SODIUM CHLORIDE 0.9 % IV SOLN: 250.0000 mL | INTRAVENOUS | Status: DC

## 2021-01-06 MED ORDER — CHLORHEXIDINE GLUCONATE 0.12 % MT SOLN: 15.0000 mL | Freq: Once | OROMUCOSAL | Status: AC

## 2021-01-06 MED ORDER — CHLORHEXIDINE GLUCONATE CLOTH 2 % EX PADS
6.0000 | MEDICATED_PAD | Freq: Once | CUTANEOUS | Status: AC
  Administered 2021-01-06: 6 via TOPICAL

## 2021-01-06 MED ORDER — ROCURONIUM BROMIDE 10 MG/ML (PF) SYRINGE
PREFILLED_SYRINGE | INTRAVENOUS | Status: DC | PRN
  Administered 2021-01-06: 50 mg via INTRAVENOUS
  Administered 2021-01-06: 30 mg via INTRAVENOUS
  Administered 2021-01-06: 50 mg via INTRAVENOUS
  Administered 2021-01-06: 70 mg via INTRAVENOUS
  Administered 2021-01-06: 30 mg via INTRAVENOUS

## 2021-01-06 MED ORDER — METHOCARBAMOL 1000 MG/10ML IJ SOLN
500.0000 mg | Freq: Four times a day (QID) | INTRAVENOUS | Status: DC | PRN
  Filled 2021-01-06: qty 5

## 2021-01-06 MED ORDER — ONDANSETRON HCL 4 MG/2ML IJ SOLN
4.0000 mg | Freq: Four times a day (QID) | INTRAMUSCULAR | Status: DC | PRN
  Administered 2021-01-07: 4 mg via INTRAVENOUS
  Filled 2021-01-06 (×2): qty 2

## 2021-01-06 MED ORDER — OXYCODONE HCL 5 MG PO TABS: 5.0000 mg | ORAL_TABLET | Freq: Once | ORAL | Status: DC | PRN

## 2021-01-06 MED ORDER — OXYCODONE HCL 5 MG/5ML PO SOLN: 5.0000 mg | Freq: Once | ORAL | Status: DC | PRN

## 2021-01-06 MED ORDER — ORAL CARE MOUTH RINSE: 15.0000 mL | Freq: Once | OROMUCOSAL | Status: AC

## 2021-01-06 SURGICAL SUPPLY — 77 items
ADH SKN CLS APL DERMABOND .7 (GAUZE/BANDAGES/DRESSINGS) ×1
BAG COUNTER SPONGE SURGICOUNT (BAG) ×6 IMPLANT
BAG SPNG CNTER NS LX DISP (BAG) ×3
BAND INSRT 18 STRL LF DISP RB (MISCELLANEOUS)
BAND RUBBER #18 3X1/16 STRL (MISCELLANEOUS) IMPLANT
BASKET BONE COLLECTION (BASKET) ×2 IMPLANT
BLADE CLIPPER SURG (BLADE) IMPLANT
BUR MATCHSTICK NEURO 3.0 LAGG (BURR) ×2 IMPLANT
BUR PRECISION FLUTE 5.0 (BURR) ×2 IMPLANT
CANISTER SUCT 3000ML PPV (MISCELLANEOUS) ×2 IMPLANT
CNTNR URN SCR LID CUP LEK RST (MISCELLANEOUS) ×1 IMPLANT
CONT SPEC 4OZ STRL OR WHT (MISCELLANEOUS) ×4
COVER BACK TABLE 60X90IN (DRAPES) ×2 IMPLANT
DECANTER SPIKE VIAL GLASS SM (MISCELLANEOUS) IMPLANT
DERMABOND ADVANCED (GAUZE/BANDAGES/DRESSINGS) ×1
DERMABOND ADVANCED .7 DNX12 (GAUZE/BANDAGES/DRESSINGS) ×2 IMPLANT
DRAIN JACKSON PRATT 10MM FLAT (MISCELLANEOUS) ×2 IMPLANT
DRAPE 3/4 80X56 (DRAPES) ×2 IMPLANT
DRAPE C-ARM 42X72 X-RAY (DRAPES) ×2 IMPLANT
DRAPE C-ARMOR (DRAPES) ×2 IMPLANT
DRAPE LAPAROTOMY 100X72X124 (DRAPES) ×2 IMPLANT
DRAPE MICROSCOPE LEICA (MISCELLANEOUS) IMPLANT
DRSG AQUACEL AG ADV 3.5X14 (GAUZE/BANDAGES/DRESSINGS) ×2 IMPLANT
DRSG OPSITE POSTOP 4X6 (GAUZE/BANDAGES/DRESSINGS) IMPLANT
DURAPREP 26ML APPLICATOR (WOUND CARE) ×2 IMPLANT
ELECT BLADE INSULATED 6.5IN (ELECTROSURGICAL) ×2
ELECT REM PT RETURN 9FT ADLT (ELECTROSURGICAL) ×2
ELECTRODE BLDE INSULATED 6.5IN (ELECTROSURGICAL) ×1 IMPLANT
ELECTRODE REM PT RTRN 9FT ADLT (ELECTROSURGICAL) ×1 IMPLANT
EVACUATOR SILICONE 100CC (DRAIN) ×1 IMPLANT
GAUZE 4X4 16PLY ~~LOC~~+RFID DBL (SPONGE) ×2 IMPLANT
GAUZE SPONGE 4X4 12PLY STRL (GAUZE/BANDAGES/DRESSINGS) IMPLANT
GLOVE EXAM NITRILE XL STR (GLOVE) IMPLANT
GLOVE SURG LTX SZ7.5 (GLOVE) ×4 IMPLANT
GLOVE SURG UNDER POLY LF SZ7.5 (GLOVE) ×2 IMPLANT
GOWN STRL REUS W/ TWL LRG LVL3 (GOWN DISPOSABLE) ×1 IMPLANT
GOWN STRL REUS W/ TWL XL LVL3 (GOWN DISPOSABLE) ×1 IMPLANT
GOWN STRL REUS W/TWL 2XL LVL3 (GOWN DISPOSABLE) IMPLANT
GOWN STRL REUS W/TWL LRG LVL3 (GOWN DISPOSABLE) ×2
GOWN STRL REUS W/TWL XL LVL3 (GOWN DISPOSABLE) ×2
GRAFT BN 10X1XDBM MAGNIFUSE (Bone Implant) IMPLANT
GRAFT BONE MAGNIFUSE 1X10CM (Bone Implant) ×2 IMPLANT
HEMOSTAT POWDER KIT SURGIFOAM (HEMOSTASIS) ×2 IMPLANT
KIT BASIN OR (CUSTOM PROCEDURE TRAY) ×2 IMPLANT
KIT TURNOVER KIT B (KITS) ×2 IMPLANT
MILL MEDIUM DISP (BLADE) ×2 IMPLANT
NDL HYPO 18GX1.5 BLUNT FILL (NEEDLE) IMPLANT
NEEDLE HYPO 18GX1.5 BLUNT FILL (NEEDLE) IMPLANT
NEEDLE HYPO 21X1.5 SAFETY (NEEDLE) ×2 IMPLANT
NEEDLE HYPO 22GX1.5 SAFETY (NEEDLE) ×2 IMPLANT
NEEDLE SPNL 18GX3.5 QUINCKE PK (NEEDLE) IMPLANT
NS IRRIG 1000ML POUR BTL (IV SOLUTION) ×2 IMPLANT
PACK LAMINECTOMY NEURO (CUSTOM PROCEDURE TRAY) ×2 IMPLANT
PAD ARMBOARD 7.5X6 YLW CONV (MISCELLANEOUS) ×6 IMPLANT
ROD 5.5MM SPINAL SOLERA (Rod) ×2 IMPLANT
SCREW 6.5X40 (Screw) ×2 IMPLANT
SCREW SET SOLERA (Screw) ×16 IMPLANT
SCREW SET SOLERA TI5.5 (Screw) IMPLANT
SCREW SOLERA 45X5.5XMA NS SPNE (Screw) IMPLANT
SCREW SOLERA 45X6.5XMA NS SPNE (Screw) IMPLANT
SCREW SOLERA 5.5X45MM (Screw) ×4 IMPLANT
SCREW SOLERA 6.5X35MM (Screw) ×2 IMPLANT
SCREW SOLERA 6.5X45MM (Screw) ×8 IMPLANT
SPONGE SURGIFOAM ABS GEL 100 (HEMOSTASIS) IMPLANT
SPONGE T-LAP 4X18 ~~LOC~~+RFID (SPONGE) ×2 IMPLANT
STAPLER VISISTAT 35W (STAPLE) ×2 IMPLANT
SUT MNCRL AB 3-0 PS2 18 (SUTURE) ×2 IMPLANT
SUT VIC AB 0 CT1 18XCR BRD8 (SUTURE) ×2 IMPLANT
SUT VIC AB 0 CT1 8-18 (SUTURE) ×4
SUT VIC AB 2-0 CP2 18 (SUTURE) ×6 IMPLANT
SUT VIC AB 3-0 SH 8-18 (SUTURE) ×2 IMPLANT
SUT VICRYL 3-0 RB1 18 ABS (SUTURE) IMPLANT
SYR 30ML LL (SYRINGE) ×2 IMPLANT
TOWEL GREEN STERILE (TOWEL DISPOSABLE) ×2 IMPLANT
TOWEL GREEN STERILE FF (TOWEL DISPOSABLE) ×2 IMPLANT
TRAY FOLEY MTR SLVR 16FR STAT (SET/KITS/TRAYS/PACK) ×2 IMPLANT
WATER STERILE IRR 1000ML POUR (IV SOLUTION) ×2 IMPLANT

## 2021-01-06 NOTE — Transfer of Care (Signed)
Immediate Anesthesia Transfer of Care Note  Patient: Tyrone Stout  Procedure(s) Performed: THORACIC ELEVEN-LUMBAR THREE POSTERIOR FUSION, OPEN REDUCTION OF FRACTURE, LAMINOTOMY  Patient Location: PACU  Anesthesia Type:General  Level of Consciousness: drowsy  Airway & Oxygen Therapy: Patient Spontanous Breathing and Patient connected to nasal cannula oxygen  Post-op Assessment: Report given to RN and Post -op Vital signs reviewed and stable  Post vital signs: Reviewed and stable  Last Vitals:  Vitals Value Taken Time  BP 139/87 01/06/21 1748  Temp    Pulse 109 01/06/21 1752  Resp 19 01/06/21 1751  SpO2 90 % 01/06/21 1752  Vitals shown include unvalidated device data.  Last Pain:  Vitals:   01/06/21 1208  TempSrc:   PainSc: 4       Patients Stated Pain Goal: 4 (01/06/21 1153)  Complications: No notable events documented.

## 2021-01-06 NOTE — Anesthesia Procedure Notes (Signed)
Procedure Name: Intubation Date/Time: 01/06/2021 12:54 PM Performed by: Adria Dill, CRNA Pre-anesthesia Checklist: Patient identified, Emergency Drugs available, Suction available and Patient being monitored Patient Re-evaluated:Patient Re-evaluated prior to induction Oxygen Delivery Method: Circle system utilized Preoxygenation: Pre-oxygenation with 100% oxygen Induction Type: IV induction Ventilation: Mask ventilation without difficulty and Oral airway inserted - appropriate to patient size Laryngoscope Size: Hyacinth Meeker and 2 Grade View: Grade I Tube type: Oral Tube size: 7.5 mm Number of attempts: 1 Airway Equipment and Method: Stylet Placement Confirmation: ETT inserted through vocal cords under direct vision, positive ETCO2 and breath sounds checked- equal and bilateral Secured at: 22 cm Tube secured with: Tape Dental Injury: Teeth and Oropharynx as per pre-operative assessment
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
## 2021-01-06 NOTE — Plan of Care (Signed)

## 2021-01-07 ENCOUNTER — Encounter (HOSPITAL_COMMUNITY): Payer: Self-pay | Admitting: Neurosurgery

## 2021-01-07 LAB — CBC
HCT: 32.3 % — ABNORMAL LOW (ref 39.0–52.0)
Hemoglobin: 10.9 g/dL — ABNORMAL LOW (ref 13.0–17.0)
MCH: 31.1 pg (ref 26.0–34.0)
MCHC: 33.7 g/dL (ref 30.0–36.0)
MCV: 92 fL (ref 80.0–100.0)
Platelets: 228 10*3/uL (ref 150–400)
RBC: 3.51 MIL/uL — ABNORMAL LOW (ref 4.22–5.81)
RDW: 11.9 % (ref 11.5–15.5)
WBC: 15 10*3/uL — ABNORMAL HIGH (ref 4.0–10.5)
nRBC: 0 % (ref 0.0–0.2)

## 2021-01-07 LAB — BASIC METABOLIC PANEL
Anion gap: 7 (ref 5–15)
BUN: 10 mg/dL (ref 6–20)
CO2: 25 mmol/L (ref 22–32)
Calcium: 8.4 mg/dL — ABNORMAL LOW (ref 8.9–10.3)
Chloride: 98 mmol/L (ref 98–111)
Creatinine, Ser: 0.89 mg/dL (ref 0.61–1.24)
GFR, Estimated: >60 mL/min (ref >60)
Glucose, Bld: 149 mg/dL — ABNORMAL HIGH (ref 70–99)
Potassium: 4.7 mmol/L (ref 3.5–5.1)
Sodium: 130 mmol/L — ABNORMAL LOW (ref 135–145)

## 2021-01-07 MED ORDER — CHLORHEXIDINE GLUCONATE CLOTH 2 % EX PADS
6.0000 | MEDICATED_PAD | Freq: Every day | CUTANEOUS | Status: DC
  Administered 2021-01-07 – 2021-01-10 (×4): 6 via TOPICAL

## 2021-01-07 MED ORDER — MUPIROCIN 2 % EX OINT
1.0000 "application " | TOPICAL_OINTMENT | Freq: Two times a day (BID) | CUTANEOUS | Status: DC
  Administered 2021-01-07 – 2021-01-10 (×7): 1 via NASAL
  Filled 2021-01-07: qty 22

## 2021-01-07 MED ORDER — METOCLOPRAMIDE HCL 5 MG/ML IJ SOLN
10.0000 mg | Freq: Four times a day (QID) | INTRAMUSCULAR | Status: DC | PRN
  Administered 2021-01-08 (×3): 10 mg via INTRAVENOUS
  Filled 2021-01-07 (×4): qty 2

## 2021-01-07 MED ORDER — SODIUM CHLORIDE 0.9 % IV SOLN: INTRAVENOUS | Status: DC

## 2021-01-07 MED ORDER — SODIUM CHLORIDE 0.9 % IV SOLN
12.5000 mg | Freq: Three times a day (TID) | INTRAVENOUS | Status: DC | PRN
  Administered 2021-01-07 – 2021-01-08 (×2): 12.5 mg via INTRAVENOUS
  Filled 2021-01-07 (×4): qty 0.5

## 2021-01-07 NOTE — Progress Notes (Signed)
Pt has had two episodes of nausea and vomiting x 2. Pt continued to have nausea despite IV zofran given. Pt continues to experience nausea. On call provider for neurosurgery paged to inform.

## 2021-01-07 NOTE — Progress Notes (Signed)
Orthopedic Tech Progress Note Patient Details:  Tyrone Stout 04-Apr-1990 [id number]  Fitted patient with TLSO BACK BRACE   Ortho Devices Type of Ortho Device: Other (comment) Ortho Device/Splint Location: BACK Ortho Device/Splint Interventions: Ordered, Application, Adjustment   Post Interventions Patient Tolerated: Well Instructions Provided: Care of device  Donald Pore 01/07/2021, 11:01 AM

## 2021-01-07 NOTE — Progress Notes (Signed)
Orthopedic Tech Progress Note Patient Details:  Tyrone Stout [birth year] [id number]  Patient ID: Tyrone Stout, male   DOB: [birth year], 30 y.o.   MRN: [id number] Ordered brace.  Doyne Micke L Prisila Dlouhy 01/07/2021, 1:13 AM

## 2021-01-07 NOTE — Plan of Care (Signed)
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
## 2021-01-07 NOTE — Progress Notes (Signed)
Subjective: Patient reports some soreness in back, no sensory changes in legs  Objective: Vital signs in last 24 hours: Temp:  [97.1 F (36.2 C)-98.7 F (37.1 C)] 98.6 F (37 C) (11/16 0807) Pulse Rate:  [94-113] 95 (11/16 0807) Resp:  [7-21] 14 (11/16 0807) BP: (107-141)/(67-87) 124/68 (11/16 0807) SpO2:  [91 %-99 %] 95 % (11/16 0807) Weight:  [88.5 kg] 88.5 kg (11/15 1103)  Intake/Output from previous day: 11/15 0701 - 11/16 0700 In: 3652.1 [P.O.:480; I.V.:2372.1; IV Piggyback:800] Out: 3810 [Urine:3070; Drains:240; Blood:500] Intake/Output this shift: No intake/output data recorded.  Awake, alert Full strength in [place] Serosanguinous drainage in JP  Lab Results: Recent Labs    01/04/21 1945 01/07/21 0146  WBC 29.8* 15.0*  HGB 15.6 10.9*  HCT 46.1 32.3*  PLT 357 228   BMET Recent Labs    01/04/21 1945 01/07/21 0146  NA 135 130*  K 4.3 4.7  CL 102 98  CO2 23 25  GLUCOSE 110* 149*  BUN 15 10  CREATININE 0.74 0.89  CALCIUM 9.7 8.4*    Studies/Results: DG Chest 1 View  Result Date: 01/06/2021 CLINICAL DATA:  Shortness of breath EXAM: CHEST  1 VIEW COMPARISON:  None. FINDINGS: There is poor inspiration. Transverse diameter of heart is slightly increased which may be partly due to poor inspiration. There are no signs of pulmonary edema or focal pulmonary consolidation. There are linear densities in the right upper lung field and medial right lower lung fields. There is no significant pleural effusion or pneumothorax. IMPRESSION: There are no signs of pulmonary edema or focal pulmonary consolidation. There are small linear densities in the right upper and medial right lower lung fields suggesting scarring or subsegmental atelectasis. Electronically Signed   By: Ernie Avena M.D.   On: 01/06/2021 08:56   DG THORACOLUMABAR SPINE  Result Date: 01/06/2021 CLINICAL DATA:  T11-L3 posterior fusion. EXAM: THORACOLUMBAR SPINE 1V COMPARISON:  Thoracic and lumbar  spine CT and MRI 01/04/2021. FINDINGS: Intraoperative thoracolumbar spine. 5 low resolution intraoperative spot views of the thoracolumbar spine were obtained. T11-L3 posterior fusion hardware is present. Alignment is grossly anatomic. Total fluoroscopy time: 1 minute 48 seconds Total radiation dose: 53.62 micro Gy IMPRESSION: Intraoperative T11-L3 posterior fusion. Electronically Signed   By: Darliss Cheney M.D.   On: 01/06/2021 17:54   DG C-Arm 1-60 Min-No Report  Result Date: 01/06/2021 Fluoroscopy was utilized by the requesting physician.  No radiographic interpretation.   DG C-Arm 1-60 Min-No Report  Result Date: 01/06/2021 Fluoroscopy was utilized by the requesting physician.  No radiographic interpretation.   DG C-Arm 1-60 Min-No Report  Result Date: 01/06/2021 Fluoroscopy was utilized by the requesting physician.  No radiographic interpretation.   DG C-Arm 1-60 Min-No Report  Result Date: 01/06/2021 Fluoroscopy was utilized by the requesting physician.  No radiographic interpretation.    Assessment/Plan: S/p posterior fixation of L1 burst fracture - OOB with TLSO brace - PT/OT - lovenox today - cont JP - mild hyponatremia likely related to volume repletion  Tyrone Stout 01/07/2021, 9:57 AM

## 2021-01-07 NOTE — Plan of Care (Signed)
Pt is alert oriented x 4. Pt denies numbness or tingling to arms or legs. Equal strength. Dressing to surgical site to back is dry and intact. Drain intact, pt has put out total of bloody drainage. Pts catheter has been discontinued , condom cath has been applied, Pt has urinated of clear yellow urine. PRN pain medication given per order. Pt c/o pain 8/10 to back, aching and dull. Medication effective.    Problem: Education: Goal: Knowledge of General Education information will improve Description: Including pain rating scale, medication(s)/side effects and non-pharmacologic comfort measures Outcome: Progressing   Problem: Health Behavior/Discharge Planning: Goal: Ability to manage health-related needs will improve Outcome: Progressing   Problem: Clinical Measurements: Goal: Ability to maintain clinical measurements within normal limits will improve Outcome: Progressing Goal: Will remain free from infection Outcome: Progressing Goal: Diagnostic test results will improve Outcome: Progressing Goal: Respiratory complications will improve Outcome: Progressing Goal: Cardiovascular complication will be avoided Outcome: Progressing   Problem: Activity: Goal: Risk for activity intolerance will decrease Outcome: Progressing   Problem: Nutrition: Goal: Adequate nutrition will be maintained Outcome: Progressing   Problem: Coping: Goal: Level of anxiety will decrease Outcome: Progressing   Problem: Elimination: Goal: Will not experience complications related to bowel motility Outcome: Progressing Goal: Will not experience complications related to urinary retention Outcome: Progressing   Problem: Pain Managment: Goal: General experience of comfort will improve Outcome: Progressing   Problem: Safety: Goal: Ability to remain free from injury will improve Outcome: Progressing   Problem: Skin Integrity: Goal: Risk for impaired skin integrity will decrease Outcome:  Progressing

## 2021-01-07 NOTE — TOC CAGE-AID Note (Signed)
Transition of Care [facility]) - CAGE-AID Screening   Patient Details  Name: Tyrone Stout MRN: [id number] Date of Birth: 1990/04/11  Transition of Care [facility]) CM/SW Contact:    Javonne Dorko C Tarpley-Carter, LCSWA Phone Number: 01/07/2021, 3:14 PM   Clinical Narrative: Pt participated in Cage-Aid.  Pt stated he does not use substance or ETOH.  Pt was not offered resources, due to no usage of substance or ETOH.    Breuna Loveall Tarpley-Carter, MSW, LCSW-A Pronouns:  She/Her/Hers [facility] HealthTransitions of Care Clinical Social Worker Direct Number:  [phone] [email or web address]     CAGE-AID Screening:    Have You Ever Felt You Ought to Cut Down on Your Drinking or Drug Use?: No Have People Annoyed You By [employer] Your Drinking Or Drug Use?: No Have You Felt Bad Or Guilty About Your Drinking Or Drug Use?: No Have You Ever Had a Drink or Used Drugs First Thing In The Morning to Steady Your Nerves or to Get Rid of a Hangover?: No CAGE-AID Score: 0  Substance Abuse Education Offered: No
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
## 2021-01-08 ENCOUNTER — Inpatient Hospital Stay (HOSPITAL_COMMUNITY): Payer: Commercial Managed Care - PPO

## 2021-01-08 IMAGING — CR DG THORACOLUMBAR SPINE 2V
2 series · 2 of 2 positions shown · non-contrast
Comparison: [DATE]

CLINICAL DATA: Follow-up back surgery

EXAM:
THORACOLUMBAR SPINE 1V

[tl-spine ap]
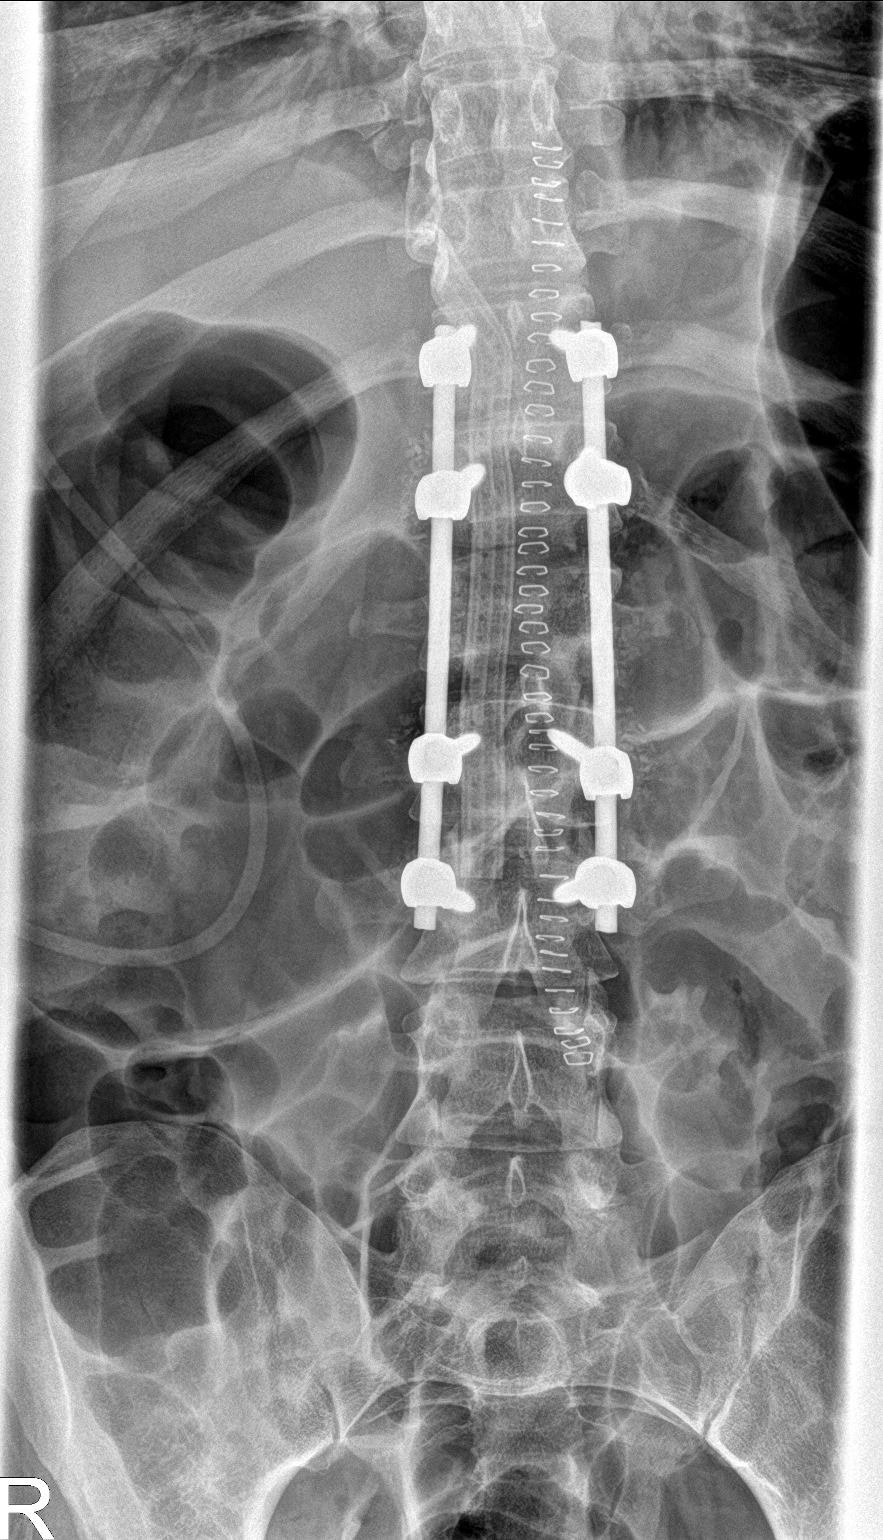

[tl-spine lat]
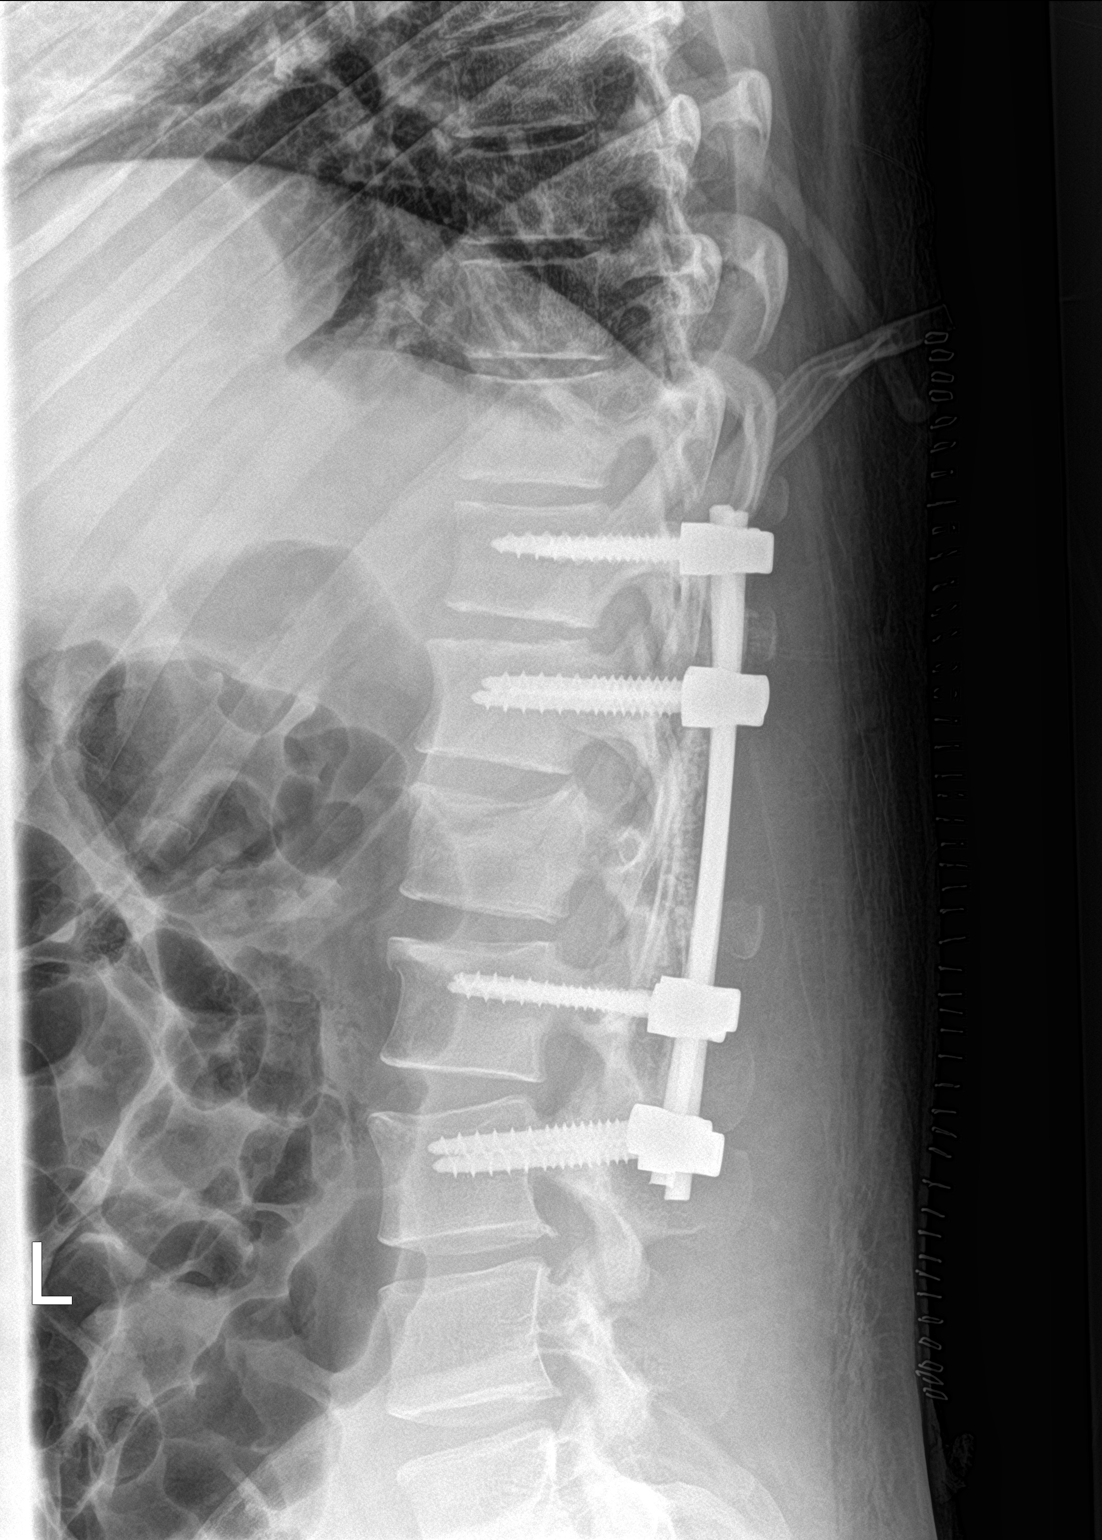

[2 of 2 positions shown; findings below may reference images not displayed]

FINDINGS: Placement of pedicle screws and posterior rods from T11 through L3.
No L1 screws. Components appear well positioned. Burst fracture at
L1 as seen previously. Superior endplate fracture at L2 as seen
previously. Minimal superior endplate depressions at L3 and L4 as
seen previously.
IMPRESSION: Good appearance following thoracolumbar fusion with pedicle screws
and posterior rods from T11-L3 as described above.

## 2021-01-08 IMAGING — CR DG ABDOMEN ACUTE W/ 1V CHEST
4 series · 4 of 4 positions shown · non-contrast
Comparison: None.

CLINICAL DATA: Nausea, vomiting, recent lumbar spine surgery

EXAM:
DG ABDOMEN ACUTE WITH 1 VIEW CHEST

[chest pa]
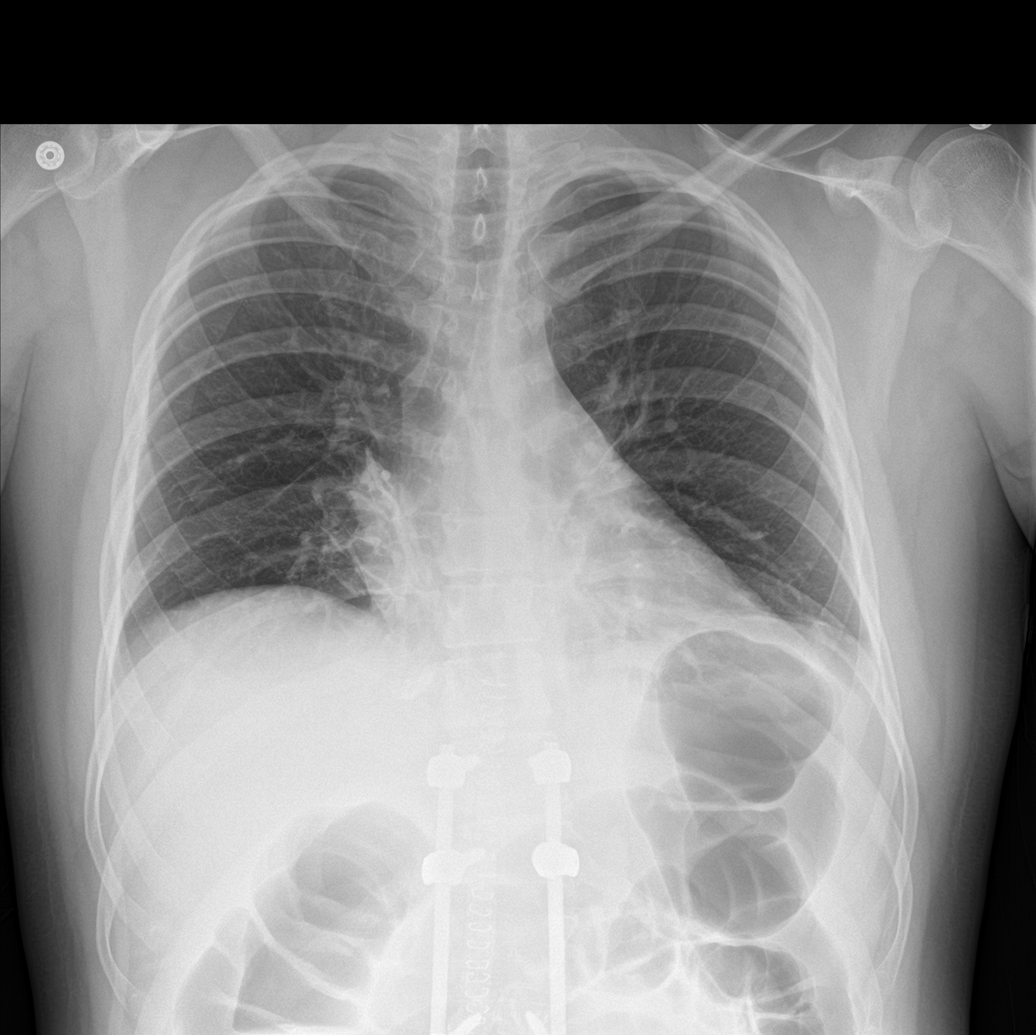

[abdomen erect (1 of 2)]
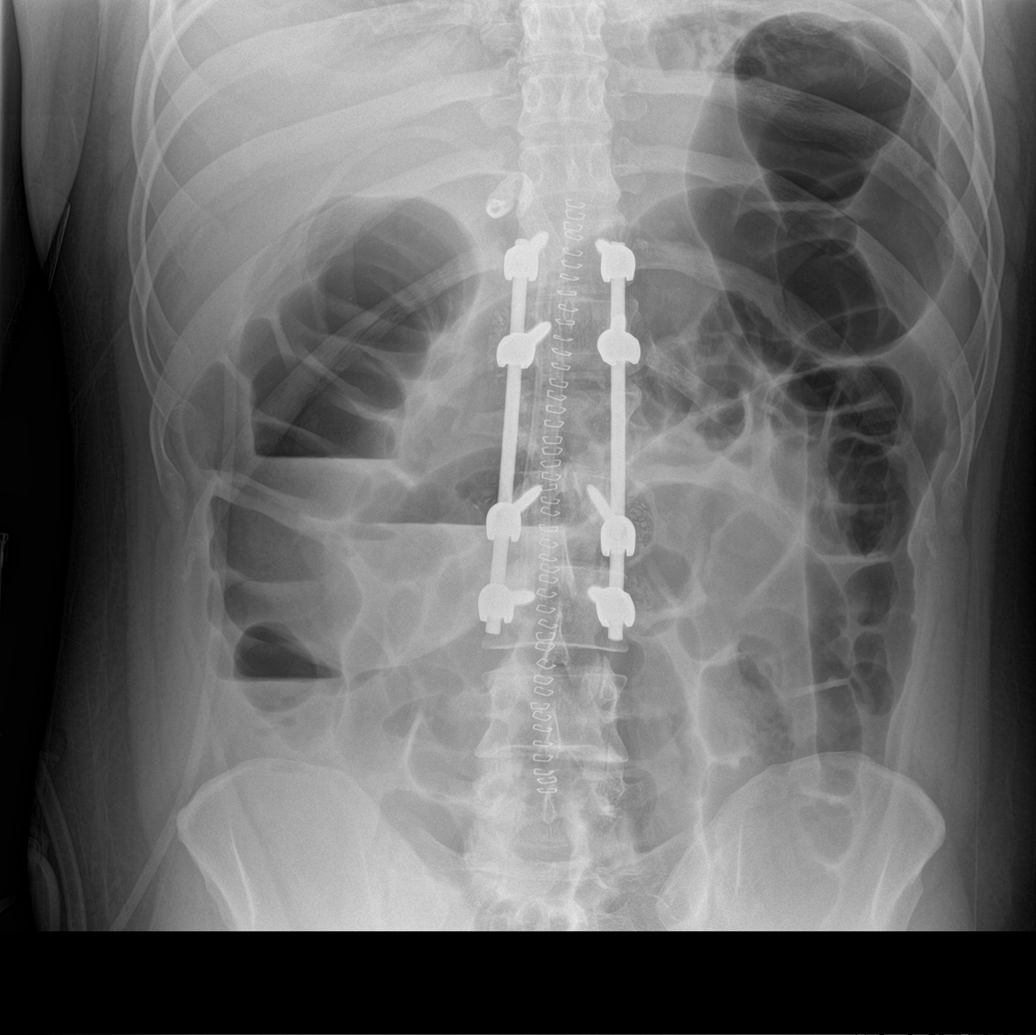

[abdomen supine]
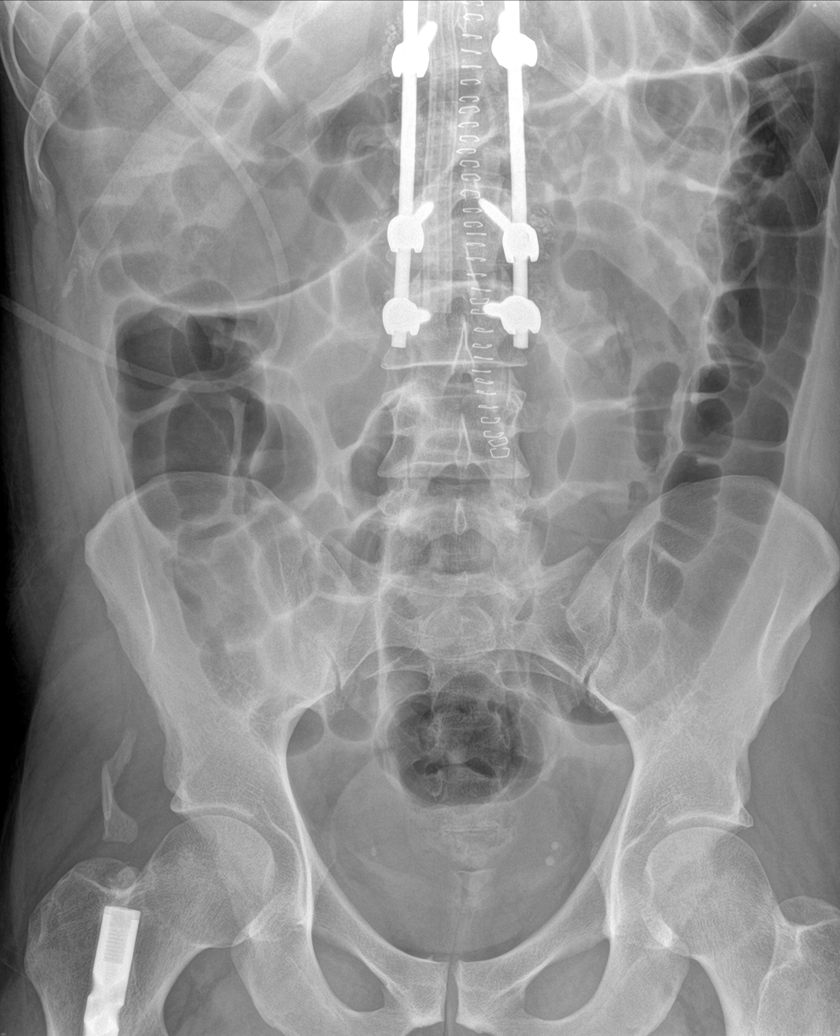

[abdomen erect (2 of 2)]
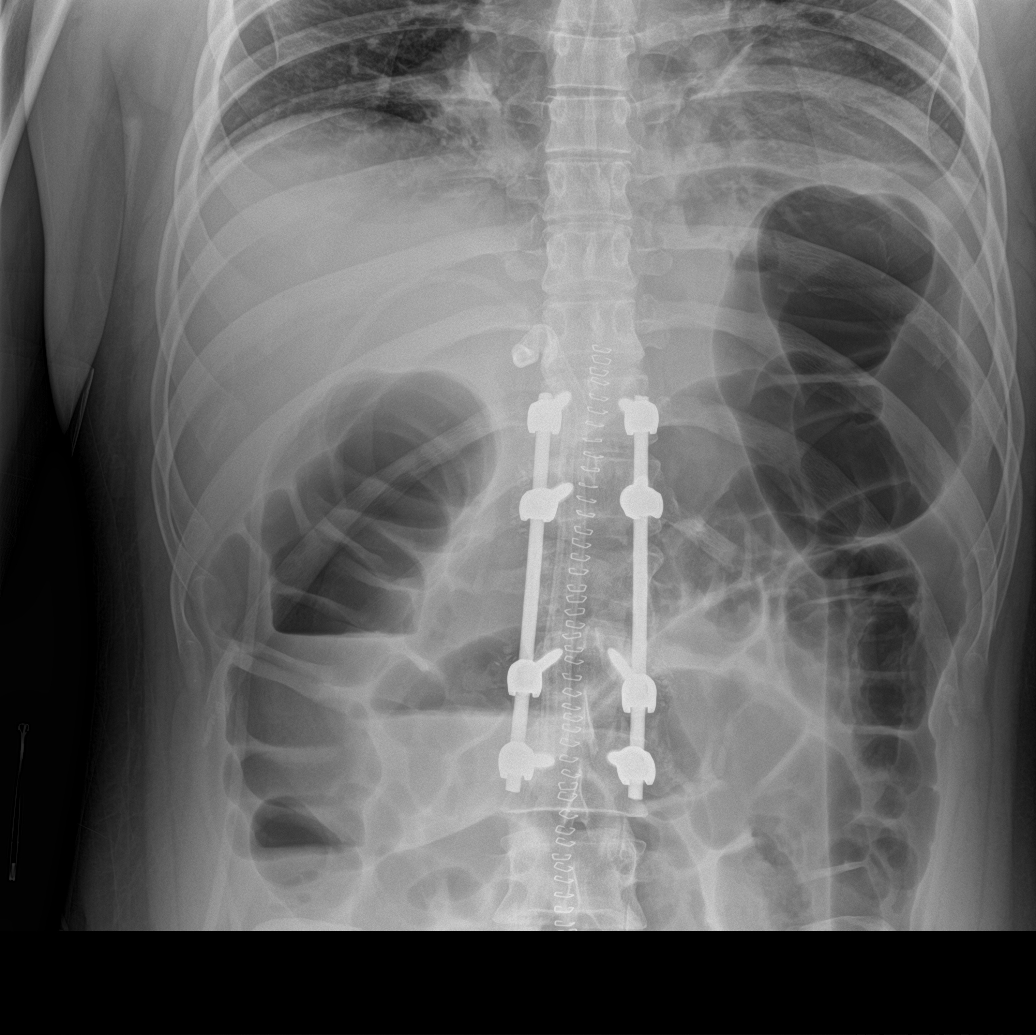

[4 of 4 positions shown; findings below may reference images not displayed]

FINDINGS: Cardiac size is within normal limits. There are linear densities in
the medial lower lung fields. There are no signs of pulmonary edema.
There is no pleural effusion or pneumothorax. There is gaseous
distention of small-bowel loops and colon. There are air-fluid
levels in the small bowel loops and right colon. There is no
pneumoperitoneum. There is evidence of lumbar fusion from T11-L3
levels. Skin staples are noted in the thoracolumbar region. There is
possible drain at the surgical site. There is previous internal
fixation in the right femur.
IMPRESSION: There is moderate dilation of small-bowel loops and colon suggesting
possible ileus. Less likely possibility would be distal colonic
obstruction. There is no pneumoperitoneum. There are linear
densities in the medial lower lung fields on both sides suggesting
subsegmental atelectasis.
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
## 2021-01-08 NOTE — Plan of Care (Signed)
Pt is alert oriented x 4. Pt pale,  c/o nausea during this shift, zofran given at 1758 was not effective, On call provider paged to inform of pts nausea/vomitting and poor intake.  received order  for phenergan 12.5mg  IV PRN Q8, this medication was not effective. Pt has also had poor apetite and poor intake on prior shift. Provider ordered reglan 10mg  IV Q6 PRN, medication was effective. Pt was also noted to have temperature of 101.1, tylenol given. Pt currently resting with eyes closed.     Problem: Education: Goal: Knowledge of General Education information will improve Description: Including pain rating scale, medication(s)/side effects and non-pharmacologic comfort measures Outcome: Progressing   Problem: Health Behavior/Discharge Planning: Goal: Ability to manage health-related needs will improve Outcome: Progressing   Problem: Clinical Measurements: Goal: Ability to maintain clinical measurements within normal limits will improve Outcome: Progressing Goal: Will remain free from infection Outcome: Progressing Goal: Diagnostic test results will improve Outcome: Progressing Goal: Respiratory complications will improve Outcome: Progressing Goal: Cardiovascular complication will be avoided Outcome: Progressing   Problem: Activity: Goal: Risk for activity intolerance will decrease Outcome: Progressing   Problem: Nutrition: Goal: Adequate nutrition will be maintained Outcome: Progressing   Problem: Coping: Goal: Level of anxiety will decrease Outcome: Progressing   Problem: Elimination: Goal: Will not experience complications related to bowel motility Outcome: Progressing Goal: Will not experience complications related to urinary retention Outcome: Progressing   Problem: Pain Managment: Goal: General experience of comfort will improve Outcome: Progressing   Problem: Safety: Goal: Ability to remain free from injury will improve Outcome: Progressing   Problem: Skin  Integrity: Goal: Risk for impaired skin integrity will decrease Outcome: Progressing

## 2021-01-08 NOTE — Progress Notes (Signed)
Reviewed x-ray which showed ileus as expected.  Instrumentation in good position with good reduction of fracture.  Will place on liquid diet, place NG tube if continued n/v

## 2021-01-08 NOTE — Plan of Care (Signed)
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
## 2021-01-08 NOTE — Anesthesia Postprocedure Evaluation (Signed)
Anesthesia Post Note  Patient: Tyrone Stout  Procedure(s) Performed: THORACIC ELEVEN-LUMBAR THREE POSTERIOR FUSION, OPEN REDUCTION OF FRACTURE, LAMINOTOMY     Patient location during evaluation: PACU Anesthesia Type: General Level of consciousness: awake and alert Pain management: pain level controlled Vital Signs Assessment: post-procedure vital signs reviewed and stable Respiratory status: spontaneous breathing, nonlabored ventilation, respiratory function stable and patient connected to nasal cannula oxygen Cardiovascular status: blood pressure returned to baseline and stable Postop Assessment: no apparent nausea or vomiting Anesthetic complications: no   No notable events documented.  Last Vitals:  Vitals:   01/08/21 0807 01/08/21 1245  BP: 138/78 129/84  Pulse: (!) 101 100  Resp: 14 18  Temp: 37.2 C 36.7 C  SpO2: 91% 95%    Last Pain:  Vitals:   01/08/21 1333  TempSrc:   PainSc: 7                  Paxon Propes

## 2021-01-08 NOTE — Progress Notes (Signed)
Subjective: Patient reports pain well controlled, but feels gaseous.  Had vomiting last night  Objective: Vital signs in last 24 hours: Temp:  [97.8 F (36.6 C)-101.1 F (38.4 C)] 99 F (37.2 C) (11/17 0807) Pulse Rate:  [93-101] 101 (11/17 0807) Resp:  [12-20] 14 (11/17 0807) BP: (122-139)/(78-84) 138/78 (11/17 0807) SpO2:  [87 %-100 %] 91 % (11/17 0807)  Intake/Output from previous day: 11/16 0701 - 11/17 0700 In: 429.1 [P.O.:120; I.V.:209.1; IV Piggyback:100] Out: 1080 [Urine:350; Drains:730] Intake/Output this shift: Total I/O In: 116.4 [I.V.:116.4] Out: -   Awake, alert, Ox3 Abd soft, NT, but protuberant Dressing c/d 5/5 strength in LEs  Lab Results: Recent Labs    01/07/21 0146  WBC 15.0*  HGB 10.9*  HCT 32.3*  PLT 228   BMET Recent Labs    01/07/21 0146  NA 130*  K 4.7  CL 98  CO2 25  GLUCOSE 149*  BUN 10  CREATININE 0.89  CALCIUM 8.4*    Studies/Results: DG THORACOLUMABAR SPINE  Result Date: 01/06/2021 CLINICAL DATA:  T11-L3 posterior fusion. EXAM: THORACOLUMBAR SPINE 1V COMPARISON:  Thoracic and lumbar spine CT and MRI 01/04/2021. FINDINGS: Intraoperative thoracolumbar spine. 5 low resolution intraoperative spot views of the thoracolumbar spine were obtained. T11-L3 posterior fusion hardware is present. Alignment is grossly anatomic. Total fluoroscopy time: 1 minute 48 seconds Total radiation dose: 53.62 micro Gy IMPRESSION: Intraoperative T11-L3 posterior fusion. Electronically Signed   By: Darliss Cheney M.D.   On: 01/06/2021 17:54   DG C-Arm 1-60 Min-No Report  Result Date: 01/06/2021 Fluoroscopy was utilized by the requesting physician.  No radiographic interpretation.   DG C-Arm 1-60 Min-No Report  Result Date: 01/06/2021 Fluoroscopy was utilized by the requesting physician.  No radiographic interpretation.   DG C-Arm 1-60 Min-No Report  Result Date: 01/06/2021 Fluoroscopy was utilized by the requesting physician.  No radiographic  interpretation.   DG C-Arm 1-60 Min-No Report  Result Date: 01/06/2021 Fluoroscopy was utilized by the requesting physician.  No radiographic interpretation.    Assessment/Plan: S/p T11-L3 posterior fixation - cont mobilization - bowel regimen - will get abdominal series x-rays - fluids, advance diet slowly in face of ileus   Bedelia Person 01/08/2021, 8:52 AM

## 2021-01-08 NOTE — Plan of Care (Signed)
Pt is alert oriented x 4. Pt c/o back pain 7/10, prn oxycodone given per order with effective results. Pt ambulated to bathroom,+1 assist. Pt c/o nausea prn reglan given with effective results. Pts signigicant other at bedside. IV fluids continued per order.    Problem: Education: Goal: Knowledge of General Education information will improve Description: Including pain rating scale, medication(s)/side effects and non-pharmacologic comfort measures Outcome: Progressing   Problem: Health Behavior/Discharge Planning: Goal: Ability to manage health-related needs will improve Outcome: Progressing   Problem: Clinical Measurements: Goal: Ability to maintain clinical measurements within normal limits will improve Outcome: Progressing Goal: Will remain free from infection Outcome: Progressing Goal: Diagnostic test results will improve Outcome: Progressing Goal: Respiratory complications will improve Outcome: Progressing Goal: Cardiovascular complication will be avoided Outcome: Progressing   Problem: Activity: Goal: Risk for activity intolerance will decrease Outcome: Progressing   Problem: Nutrition: Goal: Adequate nutrition will be maintained Outcome: Progressing   Problem: Coping: Goal: Level of anxiety will decrease Outcome: Progressing   Problem: Elimination: Goal: Will not experience complications related to bowel motility Outcome: Progressing Goal: Will not experience complications related to urinary retention Outcome: Progressing   Problem: Pain Managment: Goal: General experience of comfort will improve Outcome: Progressing   Problem: Safety: Goal: Ability to remain free from injury will improve Outcome: Progressing   Problem: Skin Integrity: Goal: Risk for impaired skin integrity will decrease Outcome: Progressing

## 2021-01-09 LAB — BASIC METABOLIC PANEL
Anion gap: 6 (ref 5–15)
BUN: 9 mg/dL (ref 6–20)
CO2: 30 mmol/L (ref 22–32)
Calcium: 8 mg/dL — ABNORMAL LOW (ref 8.9–10.3)
Chloride: 98 mmol/L (ref 98–111)
Creatinine, Ser: 0.85 mg/dL (ref 0.61–1.24)
GFR, Estimated: >60 mL/min (ref >60)
Glucose, Bld: 101 mg/dL — ABNORMAL HIGH (ref 70–99)
Potassium: 3.7 mmol/L (ref 3.5–5.1)
Sodium: 134 mmol/L — ABNORMAL LOW (ref 135–145)

## 2021-01-09 LAB — CBC WITH DIFFERENTIAL/PLATELET
Abs Immature Granulocytes: 0.2 10*3/uL — ABNORMAL HIGH (ref 0.00–0.07)
Basophils Absolute: 0 10*3/uL (ref 0.0–0.1)
Basophils Relative: 0 %
Eosinophils Absolute: 0.1 10*3/uL (ref 0.0–0.5)
Eosinophils Relative: 1 %
HCT: 29.2 % — ABNORMAL LOW (ref 39.0–52.0)
Hemoglobin: 9.6 g/dL — ABNORMAL LOW (ref 13.0–17.0)
Immature Granulocytes: 2 %
Lymphocytes Relative: 21 %
Lymphs Abs: 2.7 10*3/uL (ref 0.7–4.0)
MCH: 30.1 pg (ref 26.0–34.0)
MCHC: 32.9 g/dL (ref 30.0–36.0)
MCV: 91.5 fL (ref 80.0–100.0)
Monocytes Absolute: 1.3 10*3/uL — ABNORMAL HIGH (ref 0.1–1.0)
Monocytes Relative: 10 %
Neutro Abs: 9 10*3/uL — ABNORMAL HIGH (ref 1.7–7.7)
Neutrophils Relative %: 66 %
Platelets: 289 10*3/uL (ref 150–400)
RBC: 3.19 MIL/uL — ABNORMAL LOW (ref 4.22–5.81)
RDW: 12 % (ref 11.5–15.5)
WBC: 13.2 10*3/uL — ABNORMAL HIGH (ref 4.0–10.5)
nRBC: 0 % (ref 0.0–0.2)
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
## 2021-01-10 MED ORDER — ACETAMINOPHEN 500 MG PO TABS: ORAL_TABLET | ORAL | 2 refills | Status: DC

## 2021-01-10 MED ORDER — METHOCARBAMOL 500 MG PO TABS: 500.0000 mg | ORAL_TABLET | Freq: Four times a day (QID) | ORAL | 3 refills | Status: DC | PRN

## 2021-01-10 MED ORDER — OXYCODONE HCL 5 MG PO TABS: 5.0000 mg | ORAL_TABLET | ORAL | 0 refills | Status: DC | PRN

## 2021-01-10 NOTE — Plan of Care (Signed)
Pt is alert oriented x 4. Pt has been ambulating in room with assistance of pts significant other. Pts pain level has decreased to 5/10 aching and dull pain. Pt denies numbness or tingling to arms or legs. Pt denies nausea.   Problem: Education: Goal: Knowledge of General Education information will improve Description: Including pain rating scale, medication(s)/side effects and non-pharmacologic comfort measures 01/10/2021 0303 by Vergie Living, RN Outcome: Progressing 01/10/2021 0303 by Vergie Living, RN Outcome: Progressing   Problem: Health Behavior/Discharge Planning: Goal: Ability to manage health-related needs will improve 01/10/2021 0303 by Vergie Living, RN Outcome: Progressing 01/10/2021 0303 by Vergie Living, RN Outcome: Progressing   Problem: Clinical Measurements: Goal: Ability to maintain clinical measurements within normal limits will improve 01/10/2021 0303 by Vergie Living, RN Outcome: Progressing 01/10/2021 0303 by Vergie Living, RN Outcome: Progressing Goal: Will remain free from infection 01/10/2021 0303 by Vergie Living, RN Outcome: Progressing 01/10/2021 0303 by Vergie Living, RN Outcome: Progressing Goal: Diagnostic test results will improve 01/10/2021 0303 by Vergie Living, RN Outcome: Progressing 01/10/2021 0303 by Vergie Living, RN Outcome: Progressing Goal: Respiratory complications will improve 01/10/2021 0303 by Vergie Living, RN Outcome: Progressing 01/10/2021 0303 by Vergie Living, RN Outcome: Progressing Goal: Cardiovascular complication will be avoided 01/10/2021 0303 by Vergie Living, RN Outcome: Progressing 01/10/2021 0303 by Vergie Living, RN Outcome: Progressing   Problem: Activity: Goal: Risk for activity intolerance will decrease 01/10/2021 0303 by Vergie Living, RN Outcome: Progressing 01/10/2021 0303 by Vergie Living, RN Outcome: Progressing    Problem: Nutrition: Goal: Adequate nutrition will be maintained 01/10/2021 0303 by Vergie Living, RN Outcome: Progressing 01/10/2021 0303 by Vergie Living, RN Outcome: Progressing   Problem: Coping: Goal: Level of anxiety will decrease 01/10/2021 0303 by Vergie Living, RN Outcome: Progressing 01/10/2021 0303 by Vergie Living, RN Outcome: Progressing   Problem: Elimination: Goal: Will not experience complications related to bowel motility 01/10/2021 0303 by Vergie Living, RN Outcome: Progressing 01/10/2021 0303 by Vergie Living, RN Outcome: Progressing Goal: Will not experience complications related to urinary retention 01/10/2021 0303 by Vergie Living, RN Outcome: Progressing 01/10/2021 0303 by Vergie Living, RN Outcome: Progressing   Problem: Pain Managment: Goal: General experience of comfort will improve 01/10/2021 0303 by Vergie Living, RN Outcome: Progressing 01/10/2021 0303 by Vergie Living, RN Outcome: Progressing   Problem: Safety: Goal: Ability to remain free from injury will improve 01/10/2021 0303 by Vergie Living, RN Outcome: Progressing 01/10/2021 0303 by Vergie Living, RN Outcome: Progressing   Problem: Skin Integrity: Goal: Risk for impaired skin integrity will decrease 01/10/2021 0303 by Vergie Living, RN Outcome: Progressing 01/10/2021 0303 by Vergie Living, RN Outcome: Progressing

## 2021-01-10 NOTE — Plan of Care (Signed)
  Problem: Education: Goal: Knowledge of General Education information will improve Description: Including pain rating scale, medication(s)/side effects and non-pharmacologic comfort measures Outcome: Adequate for Discharge   Problem: Health Behavior/Discharge Planning: Goal: Ability to manage health-related needs will improve Outcome: Adequate for Discharge   Problem: Clinical Measurements: Goal: Ability to maintain clinical measurements within normal limits will improve Outcome: Adequate for Discharge Goal: Will remain free from infection Outcome: Adequate for Discharge Goal: Diagnostic test results will improve Outcome: Adequate for Discharge Goal: Respiratory complications will improve Outcome: Adequate for Discharge Goal: Cardiovascular complication will be avoided Outcome: Adequate for Discharge   Problem: Activity: Goal: Risk for activity intolerance will decrease Outcome: Adequate for Discharge   Problem: Coping: Goal: Level of anxiety will decrease Outcome: Adequate for Discharge   Problem: Elimination: Goal: Will not experience complications related to bowel motility Outcome: Adequate for Discharge Goal: Will not experience complications related to urinary retention Outcome: Adequate for Discharge   Problem: Pain Managment: Goal: General experience of comfort will improve Outcome: Adequate for Discharge   Problem: Safety: Goal: Ability to remain free from injury will improve Outcome: Adequate for Discharge   Problem: Skin Integrity: Goal: Risk for impaired skin integrity will decrease Outcome: Adequate for Discharge   
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
## 2021-01-10 NOTE — TOC Transition Note (Signed)
Transition of Care [facility]) - CM/SW Discharge Note   Patient Details  Name: Teller Wakefield MRN: [id number] Date of Birth: [birth year]  [facility]) CM/SW Contact:  Lawerance Sabal, RN Phone Number: 01/10/2021, 11:47 AM   Clinical Narrative:    Spoke w patient and he confirms that he has DME for home, RW and 3/1 have been delivered to his room. Discussed PT OT recs, patient declines CM offer to set up.     Final next level of care: Home/Self Care Barriers to Discharge: No Barriers Identified   Patient Goals and CMS Choice        Discharge Placement                       Discharge Plan and Services                DME Arranged: 3-N-1, Walker rolling DME Agency: [employer] Date DME Agency Contacted: 01/09/21 Time DME Agency Contacted: 1146 Representative spoke with at DME Agency: Verified with patient that it was ordered and has been delivered tothe room HH Arranged: Refused HH          Social Determinants of Health (SDOH) Interventions     Readmission Risk Interventions No flowsheet data found.

## 2023-01-03 ENCOUNTER — Emergency Department (HOSPITAL_COMMUNITY): Payer: Commercial Managed Care - PPO

## 2023-01-03 ENCOUNTER — Emergency Department (HOSPITAL_COMMUNITY)
Admission: EM | Admit: 2023-01-03 | Discharge: 2023-01-04 | Disposition: A | Discharge status: Home / Self Care | Payer: Commercial Managed Care - PPO | Attending: Emergency Medicine | Admitting: Emergency Medicine

## 2023-01-03 ENCOUNTER — Encounter (HOSPITAL_COMMUNITY): Payer: Self-pay

## 2023-01-03 DIAGNOSIS — M545 Low back pain, unspecified: Secondary | ICD-10-CM | POA: Diagnosis present

## 2023-01-03 MED ORDER — OXYCODONE-ACETAMINOPHEN 5-325 MG PO TABS
1.0000 | ORAL_TABLET | Freq: Once | ORAL | Status: AC
  Administered 2023-01-03: 1 via ORAL
  Filled 2023-01-03: qty 1

## 2023-01-03 NOTE — ED Triage Notes (Signed)
Pt is coming from home, Has a Hx of a broken L1/L2, he was leaning over in the garage and felt a sharp pain and warmness, has been in lots of lower back pain since. His only position of comfort is lying flat. PTA motrin at home.   Medic vitals   147/75 78hr 16rr 99%ra

## 2023-01-04 MED ORDER — KETOROLAC TROMETHAMINE 15 MG/ML IJ SOLN
15.0000 mg | Freq: Once | INTRAMUSCULAR | Status: AC
  Administered 2023-01-04: 15 mg via INTRAMUSCULAR
  Filled 2023-01-04: qty 1

## 2023-01-04 MED ORDER — MELOXICAM 15 MG PO TABS: 15.0000 mg | ORAL_TABLET | Freq: Every day | ORAL | 0 refills | Status: AC

## 2023-01-04 MED ORDER — METHYLPREDNISOLONE 4 MG PO TBPK: ORAL_TABLET | ORAL | 0 refills | Status: AC

## 2023-01-04 MED ORDER — DEXAMETHASONE SODIUM PHOSPHATE 10 MG/ML IJ SOLN
10.0000 mg | Freq: Once | INTRAMUSCULAR | Status: AC
  Administered 2023-01-04: 10 mg via INTRAMUSCULAR
  Filled 2023-01-04: qty 1

## 2023-01-04 NOTE — ED Provider Notes (Signed)
[place] EMERGENCY DEPARTMENT AT [facility] Provider Note   CSN: [id number] Arrival date & time: 01/03/23  2048     History  Chief Complaint  Patient presents with   Back Pain    L…O Tyrone Stout is a 32 y.o. male.  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  He states that yesterday he was helping move some furniture and had some tenderness in the low back.  He states that today he would lean to his side and felt a sharp pain in the low back.  He denies any numbness or weakness in the lower extremities.  He complains chiefly of pain which is relieved by rest.He denies any urinary retention, urinary difficulty, fecal incontinence, urinary incontinence, saddle anesthesia.   Back Pain      Home Medications Prior to Admission medications   Medication Sig Start Date End Date Taking? Authorizing Provider  ibuprofen (ADVIL) 200 MG tablet Take 200 mg by mouth every 6 (six) hours as needed for mild pain (pain score 1-3).   Yes [provider]  meloxicam (MOBIC) 15 MG tablet Take 1 tablet (15 mg total) by mouth daily. 01/04/23 02/03/23 Yes Darrick Grinder, PA-C  methylPREDNISolone (MEDROL DOSEPAK) 4 MG TBPK tablet Take as directed per package instructions 01/04/23  Yes Darrick Grinder, PA-C      Allergies    Patient has no known allergies.    Review of Systems   Review of Systems  Musculoskeletal:  Positive for back pain.    Physical Exam Updated Vital Signs BP 134/82 (BP Location: Right Arm)   Pulse 77   Temp 98.8 F (37.1 C) (Oral)   Resp 17   SpO2 99%  Physical Exam Vitals and nursing note reviewed.  HENT:     Head: Normocephalic and atraumatic.  Eyes:     Pupils: Pupils are equal, round, and reactive to light.  Cardiovascular:     Rate and Rhythm: Normal rate.  Pulmonary:     Effort: Pulmonary effort is normal. No respiratory  distress.  Musculoskeletal:        General: No swelling, tenderness, deformity or signs of injury. Normal range of motion.     Cervical back: Normal range of motion.  Skin:    General: Skin is dry.  Neurological:     General: No focal deficit present.     Mental Status: He is alert.     Sensory: No sensory deficit.     Motor: No weakness.  Psychiatric:        Speech: Speech normal.        Behavior: Behavior normal.     ED Results / Procedures / Treatments   Labs (all labs ordered are listed, but only abnormal results are displayed) Labs Reviewed - No data to display  EKG None  Radiology DG Lumbar Spine 2-3 Views  Result Date: 01/03/2023 CLINICAL DATA:  Back pain lumbar fusion EXAM: LUMBAR SPINE - 2-3 VIEW COMPARISON:  01/08/2021 FINDINGS: Posterior spinal hardware T11 through L3 with stable appearance of the hardware. Chronic fracture deformity at L1. Remainder of the disc spaces are grossly patent. Vertebral body heights are otherwise stable with minimal chronic superior endplate deformities at L2, L3 and L4. IMPRESSION: Posterior spinal hardware T11 through L3 with stable appearance of the hardware. Multiple chronic compression deformities as described above Electronically Signed   By: Adrian Prows.D.  On: 01/03/2023 23:54    Procedures Procedures    Medications Ordered in ED Medications  oxyCODONE-acetaminophen (PERCOCET/ROXICET) 5-325 MG per tablet 1 tablet (1 tablet Oral Given 01/03/23 2056)  ketorolac (TORADOL) 15 MG/ML injection 15 mg (15 mg Intramuscular Given 01/04/23 0050)  dexamethasone (DECADRON) injection 10 mg (10 mg Intramuscular Given 01/04/23 0050)    ED Course/ Medical Decision Making/ A&P                                 Medical Decision Making Risk Prescription drug management.   This patient presents to the ED for concern of back pain, this involves an extensive number of treatment options, and is a complaint that carries with it a high risk  of complications and morbidity.  The differential diagnosis includes fracture, dislocation, disc herniation, others   Co morbidities that complicate the patient evaluation  History of lumbar surgery   Additional history obtained:  Additional history obtained from visitor at bedside   Imaging Studies ordered:  I ordered imaging studies including plain films of the lumbar spine I independently visualized and interpreted imaging which showed  Posterior spinal hardware T11 through L3 with stable appearance of  the hardware. Multiple chronic compression deformities as described  above   I agree with the radiologist interpretation    Problem List / ED Course / Critical interventions / Medication management   I ordered medication including Decadron, Toradol, Percocet for pain/inflammation Reevaluation of the patient after these medicines showed that the patient improved I have reviewed the patients home medicines and have made adjustments as needed   Test / Admission - Considered:  No acute findings on films of the lumbar spine.  Patient administered Toradol and Decadron with some improvement of symptoms.  Plan to ambulate patient to restroom to ensure patient is able to urinate and ambulate on his own.  If patient is able to successfully do both plan to discharge home with Medrol prescription, anti-inflammatories, and recommendation for neurosurgery follow-up. Patient ambulated and urinated without difficulty. Discharge home with medrol and meloxicam, follow up with neurosurgery.          Final Clinical Impression(s) / ED Diagnoses Final diagnoses:  Acute midline low back pain without sciatica    Rx / DC Orders ED Discharge Orders          Ordered    methylPREDNISolone (MEDROL DOSEPAK) 4 MG TBPK tablet        01/04/23 0250    meloxicam (MOBIC) 15 MG tablet  Daily        01/04/23 0250              Pamala Duffel 01/04/23 0257    Palumbo, April,  MD 01/04/23 [phone]

## 2023-01-04 NOTE — ED Notes (Signed)
Pt was ambulatory to the bathroom without assistance and able to urinate.

## 2023-01-04 NOTE — Discharge Instructions (Addendum)
Please follow up with neurosurgery. Take the medrol dosepak and meloxicam. If you develop emergent symptoms please return to the emergency department.
